# Patient Record
Sex: Female | Born: 1965 | Race: Black or African American | Hispanic: No | Marital: Married | Smoking: Never smoker
Health system: Southern US, Community
[De-identification: ages and names within clinical notes are randomized; demographics above are authoritative.]

## PROBLEM LIST (undated history)

## (undated) DIAGNOSIS — G8929 Other chronic pain: Secondary | ICD-10-CM

## (undated) DIAGNOSIS — M549 Dorsalgia, unspecified: Secondary | ICD-10-CM

## (undated) DIAGNOSIS — T7840XA Allergy, unspecified, initial encounter: Secondary | ICD-10-CM

## (undated) DIAGNOSIS — D649 Anemia, unspecified: Secondary | ICD-10-CM

## (undated) DIAGNOSIS — M4120 Other idiopathic scoliosis, site unspecified: Secondary | ICD-10-CM

## (undated) DIAGNOSIS — D573 Sickle-cell trait: Secondary | ICD-10-CM

## (undated) HISTORY — PX: SPINAL FUSION: SHX223

## (undated) HISTORY — DX: Allergy, unspecified, initial encounter: T78.40XA

## (undated) HISTORY — PX: CHOLECYSTECTOMY: SHX55

## (undated) HISTORY — DX: Anemia, unspecified: D64.9

## (undated) HISTORY — DX: Other idiopathic scoliosis, site unspecified: M41.20

---

## 1993-06-05 HISTORY — PX: SPINAL FUSION: SHX223

## 1998-02-09 ENCOUNTER — Observation Stay (HOSPITAL_COMMUNITY): Admission: EM | Admit: 1998-02-09 | Discharge: 1998-02-10 | Payer: Self-pay | Admitting: Emergency Medicine

## 1998-02-09 ENCOUNTER — Encounter: Payer: Self-pay | Admitting: Emergency Medicine

## 1998-08-17 ENCOUNTER — Inpatient Hospital Stay (HOSPITAL_COMMUNITY): Admission: AD | Admit: 1998-08-17 | Discharge: 1998-08-17 | Payer: Self-pay | Admitting: Obstetrics and Gynecology

## 1998-08-19 ENCOUNTER — Inpatient Hospital Stay (HOSPITAL_COMMUNITY): Admission: AD | Admit: 1998-08-19 | Discharge: 1998-08-19 | Payer: Self-pay | Admitting: Obstetrics and Gynecology

## 1998-08-21 ENCOUNTER — Inpatient Hospital Stay (HOSPITAL_COMMUNITY): Admission: AD | Admit: 1998-08-21 | Discharge: 1998-08-21 | Payer: Self-pay | Admitting: Obstetrics and Gynecology

## 1998-09-08 ENCOUNTER — Inpatient Hospital Stay (HOSPITAL_COMMUNITY): Admission: AD | Admit: 1998-09-08 | Discharge: 1998-09-11 | Payer: Self-pay | Admitting: Obstetrics and Gynecology

## 2001-06-05 HISTORY — PX: WISDOM TOOTH EXTRACTION: SHX21

## 2002-02-25 ENCOUNTER — Other Ambulatory Visit: Admission: RE | Admit: 2002-02-25 | Discharge: 2002-02-25 | Payer: Self-pay | Admitting: Obstetrics and Gynecology

## 2003-02-27 ENCOUNTER — Encounter: Payer: Self-pay | Admitting: Emergency Medicine

## 2003-02-27 ENCOUNTER — Emergency Department (HOSPITAL_COMMUNITY): Admission: EM | Admit: 2003-02-27 | Discharge: 2003-02-27 | Payer: Self-pay | Admitting: Emergency Medicine

## 2003-03-16 ENCOUNTER — Other Ambulatory Visit: Admission: RE | Admit: 2003-03-16 | Discharge: 2003-03-16 | Payer: Self-pay | Admitting: Obstetrics and Gynecology

## 2004-07-20 ENCOUNTER — Other Ambulatory Visit: Admission: RE | Admit: 2004-07-20 | Discharge: 2004-07-20 | Payer: Self-pay | Admitting: Radiology

## 2005-04-21 ENCOUNTER — Encounter: Admission: RE | Admit: 2005-04-21 | Discharge: 2005-04-21 | Payer: Self-pay | Admitting: Internal Medicine

## 2005-05-17 ENCOUNTER — Emergency Department (HOSPITAL_COMMUNITY): Admission: EM | Admit: 2005-05-17 | Discharge: 2005-05-17 | Payer: Self-pay | Admitting: Emergency Medicine

## 2005-05-20 ENCOUNTER — Encounter (INDEPENDENT_AMBULATORY_CARE_PROVIDER_SITE_OTHER): Payer: Self-pay | Admitting: *Deleted

## 2005-05-20 ENCOUNTER — Ambulatory Visit (HOSPITAL_COMMUNITY): Admission: RE | Admit: 2005-05-20 | Discharge: 2005-05-21 | Payer: Self-pay | Admitting: Surgery

## 2005-05-30 ENCOUNTER — Encounter: Admission: RE | Admit: 2005-05-30 | Discharge: 2005-05-30 | Payer: Self-pay | Admitting: Obstetrics and Gynecology

## 2010-04-29 ENCOUNTER — Emergency Department (HOSPITAL_COMMUNITY): Admission: EM | Admit: 2010-04-29 | Discharge: 2010-04-29 | Payer: Self-pay | Admitting: Family Medicine

## 2010-06-25 ENCOUNTER — Encounter: Payer: Self-pay | Admitting: Obstetrics and Gynecology

## 2010-06-26 ENCOUNTER — Encounter: Payer: Self-pay | Admitting: Obstetrics and Gynecology

## 2010-08-16 LAB — POCT URINALYSIS DIPSTICK
Glucose, UA: NEGATIVE mg/dL
Nitrite: NEGATIVE
Protein, ur: NEGATIVE mg/dL
Urobilinogen, UA: 0.2 mg/dL (ref 0.0–1.0)

## 2010-08-16 LAB — URINE CULTURE: Culture: NO GROWTH

## 2010-10-21 NOTE — Op Note (Signed)
Kimberly Berry, Kimberly Berry             ACCOUNT NO.:  [id number]   MEDICAL RECORD NO.:  [id number]          PATIENT TYPE:  OIB   LOCATION:  5707                         FACILITY:  [facility]   PHYSICIAN:  Wilmon Arms. Corliss Skains, M.D. DATE OF BIRTH:  03/19/1966   DATE OF PROCEDURE:  05/23/2005  DATE OF DISCHARGE:  05/21/2005                                 OPERATIVE REPORT   PREOPERATIVE DIAGNOSES:  1.  Chronic calculous cholecystitis.  2.  Umbilical hernia.   POSTOPERATIVE DIAGNOSES:  1.  Chronic calculous cholecystitis.  2.  Umbilical hernia.   PROCEDURES PERFORMED:  1.  Laparoscopic cholecystectomy.  2.  Umbilical hernia repair.   SURGEON:  Wilmon Arms. Corliss Skains, M.D.   ASSISTANT:  Gita Kudo, M.D.   ANESTHESIA:  General endotracheal.   INDICATIONS:  The patient is a 45 year old female who has had documented  cholelithiasis for several years associated with abdominal and back pain on  the right.  This pain has been worsened recently and it tends to radiate  through to her back.  She was seen in consultation in the office.  As we  were performing our examination, we also noted an umbilical hernia.  The  patient presents for cholecystectomy as well as hernia repair.   DESCRIPTION OF PROCEDURE:  The patient was brought to the operating room and  placed in the supine position on the operating room table.  After an  adequate level of general endotracheal anesthesia was obtained, the  patient's abdomen was prepped with Betadine and draped in a sterile fashion.  A curvilinear incision was made just below the umbilicus.  Dissection was  carried down into the subcutaneous tissues with blunt dissection.  The  hernia was easily palpated.  We opened the fascia just below the umbilical  defect.  The peritoneal cavity was entered.  A pursestring suture of 0  Vicryl was placed around the fascial opening.  The Hasson cannula was  inserted and secured with a stay suture.  Pneumoperitoneum was obtained  by  insufflating CO2 and maintaining a maximum pressure of 15 mmHg.  The patient  was rotated to reverse Trendelenburg position slightly to her left.  A 10 mm  port was placed in the subxiphoid position.  Two 5 mm ports were placed in  the right upper quadrant.  The gallbladder was grasped with a clamp and  elevated over the edge of the liver.  The peritoneum at the hilum of the  gallbladder was opened.  The cystic duct was identified, and it was  circumferentially dissected.  The cystic artery was also identified.  A clip  was placed distally on the cystic duct.  A small opening was created on the  cystic duct.  A Cook cholangiogram catheter was brought through a separate  stab incision.  Several attempts were made to pass the catheter.  However,  we met resistance likely due to a spiral valve.  The patient's preoperative  liver function tests had been normal, so the decision was made to abort a  cholangiogram.  The cholangiogram was ligated with clips proximally, then  divided.  The  cystic artery was also ligated with clips and then divided.  Cautery was then used to remove the gallbladder from the liver bed.  The  gallbladder was grasped with a clamp and removed through the umbilical port  site.  All of the liver bed was carefully irrigated and inspected.  No  bleeding was noted.  The irrigant was suctioned out.  Pneumoperitoneum was  then released while removing the ports.  We then turned our attention to the  umbilical hernia.   We extended the transverse incision in both directions.  The patient had a  fairly large hernia sac with thin overlying skin.  The hernia sac was  dissected free from the umbilical skin.  The ends of the fascial opening  were grasped with Kocher clamps.  Prolene #1 sutures were used in figure-of-  eight fashion to close the fascial opening.  A total of four sutures were  used.  Vicryl 3-0 was then used to tack the undersurface of the umbilical  skin down to  the fascia.  Monocryl 4-0 was used to close the skin in  subcuticular fashion.  This was repeated for the other port sites.  All of  these had previously been infiltrated with 0.25% Marcaine.  Steri-Strips and  clean dressings were applied.  The patient was then extubated and brought to  the recovery room in stable condition.  All sponge, instrument and needle  counts were correct.      Wilmon Arms. Tsuei, M.D.  Electronically Signed     MKT/MEDQ  D:  05/20/2005  T:  05/23/2005  Job:  [id number]   cc:   Gaspar Garbe, M.D.  Fax: [phone]

## 2011-04-30 ENCOUNTER — Emergency Department (INDEPENDENT_AMBULATORY_CARE_PROVIDER_SITE_OTHER)
Admission: EM | Admit: 2011-04-30 | Discharge: 2011-04-30 | Disposition: A | Discharge status: Home / Self Care | Payer: Self-pay | Source: Home / Self Care | Attending: Family Medicine | Admitting: Family Medicine

## 2011-04-30 ENCOUNTER — Encounter: Payer: Self-pay | Admitting: *Deleted

## 2011-04-30 ENCOUNTER — Emergency Department (INDEPENDENT_AMBULATORY_CARE_PROVIDER_SITE_OTHER): Payer: Self-pay

## 2011-04-30 DIAGNOSIS — M5030 Other cervical disc degeneration, unspecified cervical region: Secondary | ICD-10-CM

## 2011-04-30 HISTORY — DX: Other chronic pain: G89.29

## 2011-04-30 HISTORY — DX: Dorsalgia, unspecified: M54.9

## 2011-04-30 MED ORDER — IBUPROFEN 800 MG PO TABS: 800.0000 mg | ORAL_TABLET | Freq: Three times a day (TID) | ORAL | Status: AC

## 2011-04-30 MED ORDER — CYCLOBENZAPRINE HCL 5 MG PO TABS: 5.0000 mg | ORAL_TABLET | Freq: Three times a day (TID) | ORAL | Status: AC | PRN

## 2011-04-30 NOTE — ED Provider Notes (Signed)
History     CSN: [id number] Arrival date & time: 04/30/2011  9:57 AM   First MD Initiated Contact with Patient 04/30/11 0930      Chief Complaint  Patient presents with  . Neck Pain  . Jaw Pain  . Shoulder Pain    (Consider location/radiation/quality/duration/timing/severity/associated sxs/prior treatment) Patient is a 45 y.o. female presenting with neck pain and shoulder pain. The history is provided by the patient.  Neck Pain  This is a new problem. The current episode started more than 2 days ago (sitting at table when started, NKI.). The problem occurs constantly. The problem has not changed since onset.The pain is associated with an unknown factor. There has been no fever. The pain is present in the right side. The quality of the pain is described as shooting. The pain radiates to the right scapula, right shoulder and right arm. The pain is mild. The pain is the same all the time. Pertinent negatives include no paresis, no tingling and no weakness. She has tried NSAIDs for the symptoms. The treatment provided no relief.  Shoulder Pain    No past medical history on file.  No past surgical history on file.  No family history on file.  History  Substance Use Topics  . Smoking status: Not on file  . Smokeless tobacco: Not on file  . Alcohol Use: Not on file    OB History    No data available      Review of Systems  Constitutional: Negative.   HENT: Positive for neck pain. Negative for ear pain, sore throat, facial swelling and neck stiffness.   Skin: Negative.   Neurological: Negative.  Negative for tingling and weakness.    Allergies  Review of patient's allergies indicates not on file.  Home Medications  No current outpatient prescriptions on file.  There were no vitals taken for this visit.  Physical Exam  Nursing note and vitals reviewed. Constitutional: She is oriented to person, place, and time. She appears well-developed and well-nourished.  HENT:    Head: Normocephalic.  Right Ear: External ear normal.  Left Ear: External ear normal.  Mouth/Throat: Oropharynx is clear and moist.  Eyes: Pupils are equal, round, and reactive to light.  Neck: Normal range of motion.  Musculoskeletal: She exhibits tenderness.       Right shoulder: She exhibits tenderness. She exhibits normal range of motion, no bony tenderness, no swelling and normal strength.  Lymphadenopathy:    She has no cervical adenopathy.  Neurological: She is alert and oriented to person, place, and time. She has normal reflexes. No cranial nerve deficit.    ED Course  Procedures (including critical care time)  Labs Reviewed - No data to display No results found.   No diagnosis found.    MDM  X-rays reviewed and report per radiologist.         Barkley Bruns, MD 04/30/11 1110

## 2011-04-30 NOTE — ED Notes (Signed)
Started w/ intermittent right jaw, lateral neck, right upper arm, and right posterior shoulder pain 11/22; pain has since become constant and more severe.  Denies parasthesias.  Has been taking Aleve and willow bark with minimal relief.  Right neck is slightly tender to palpation.

## 2011-05-05 ENCOUNTER — Telehealth (HOSPITAL_COMMUNITY): Payer: Self-pay | Admitting: *Deleted

## 2016-01-27 ENCOUNTER — Other Ambulatory Visit: Payer: Self-pay | Admitting: Nurse Practitioner

## 2016-01-27 ENCOUNTER — Ambulatory Visit
Admission: RE | Admit: 2016-01-27 | Discharge: 2016-01-27 | Disposition: A | Discharge status: Home / Self Care | Payer: No Typology Code available for payment source | Source: Ambulatory Visit | Attending: Nurse Practitioner | Admitting: Nurse Practitioner

## 2016-01-27 DIAGNOSIS — M25571 Pain in right ankle and joints of right foot: Secondary | ICD-10-CM

## 2016-10-15 ENCOUNTER — Encounter (HOSPITAL_COMMUNITY): Payer: Self-pay | Admitting: Emergency Medicine

## 2016-10-15 ENCOUNTER — Emergency Department (HOSPITAL_COMMUNITY)
Admission: EM | Admit: 2016-10-15 | Discharge: 2016-10-15 | Disposition: A | Discharge status: Home / Self Care | Payer: Self-pay | Attending: Emergency Medicine | Admitting: Emergency Medicine

## 2016-10-15 ENCOUNTER — Ambulatory Visit (HOSPITAL_COMMUNITY)
Admission: EM | Admit: 2016-10-15 | Discharge: 2016-10-15 | Disposition: A | Discharge status: Nursing Home (Includes ICF) | Payer: Self-pay | Attending: Internal Medicine | Admitting: Internal Medicine

## 2016-10-15 DIAGNOSIS — Z7982 Long term (current) use of aspirin: Secondary | ICD-10-CM | POA: Insufficient documentation

## 2016-10-15 DIAGNOSIS — Z79899 Other long term (current) drug therapy: Secondary | ICD-10-CM | POA: Insufficient documentation

## 2016-10-15 DIAGNOSIS — R1010 Upper abdominal pain, unspecified: Secondary | ICD-10-CM

## 2016-10-15 DIAGNOSIS — K2970 Gastritis, unspecified, without bleeding: Secondary | ICD-10-CM | POA: Insufficient documentation

## 2016-10-15 LAB — COMPREHENSIVE METABOLIC PANEL
ALBUMIN: 4 g/dL (ref 3.5–5.0)
ALT: 17 U/L (ref 14–54)
AST: 22 U/L (ref 15–41)
Alkaline Phosphatase: 73 U/L (ref 38–126)
Anion gap: 11 (ref 5–15)
BUN: 18 mg/dL (ref 6–20)
CHLORIDE: 104 mmol/L (ref 101–111)
CO2: 21 mmol/L — AB (ref 22–32)
CREATININE: 0.69 mg/dL (ref 0.44–1.00)
Calcium: 9.1 mg/dL (ref 8.9–10.3)
GFR calc Af Amer: >60 mL/min (ref >60)
GFR calc non Af Amer: >60 mL/min (ref >60)
GLUCOSE: 82 mg/dL (ref 65–99)
Potassium: 3.7 mmol/L (ref 3.5–5.1)
SODIUM: 136 mmol/L (ref 135–145)
Total Bilirubin: 0.6 mg/dL (ref 0.3–1.2)
Total Protein: 7.9 g/dL (ref 6.5–8.1)

## 2016-10-15 LAB — POCT I-STAT, CHEM 8
BUN: 21 mg/dL — AB (ref 6–20)
CHLORIDE: 107 mmol/L (ref 101–111)
Calcium, Ion: 1.11 mmol/L — ABNORMAL LOW (ref 1.15–1.40)
Creatinine, Ser: 0.7 mg/dL (ref 0.44–1.00)
Glucose, Bld: 79 mg/dL (ref 65–99)
HCT: 37 % (ref 36.0–46.0)
Hemoglobin: 12.6 g/dL (ref 12.0–15.0)
POTASSIUM: 3.8 mmol/L (ref 3.5–5.1)
SODIUM: 140 mmol/L (ref 135–145)
TCO2: 24 mmol/L (ref 0–100)

## 2016-10-15 LAB — CBC
HCT: 37.9 % (ref 36.0–46.0)
Hemoglobin: 12.4 g/dL (ref 12.0–15.0)
MCH: 26.8 pg (ref 26.0–34.0)
MCHC: 32.7 g/dL (ref 30.0–36.0)
MCV: 82 fL (ref 78.0–100.0)
PLATELETS: 246 10*3/uL (ref 150–400)
RBC: 4.62 MIL/uL (ref 3.87–5.11)
RDW: 14.4 % (ref 11.5–15.5)
WBC: 7.9 10*3/uL (ref 4.0–10.5)

## 2016-10-15 LAB — LIPASE, BLOOD: LIPASE: 24 U/L (ref 11–51)

## 2016-10-15 MED ORDER — SUCRALFATE 1 G PO TABS: 1.0000 g | ORAL_TABLET | Freq: Three times a day (TID) | ORAL | 0 refills | Status: DC

## 2016-10-15 MED ORDER — ONDANSETRON HCL 4 MG/2ML IJ SOLN
4.0000 mg | Freq: Once | INTRAMUSCULAR | Status: AC
  Administered 2016-10-15: 4 mg via INTRAVENOUS
  Filled 2016-10-15: qty 2

## 2016-10-15 MED ORDER — GI COCKTAIL ~~LOC~~
30.0000 mL | Freq: Once | ORAL | Status: AC
  Administered 2016-10-15: 30 mL via ORAL
  Filled 2016-10-15: qty 30

## 2016-10-15 MED ORDER — MORPHINE SULFATE (PF) 4 MG/ML IV SOLN
2.0000 mg | Freq: Once | INTRAVENOUS | Status: AC
  Administered 2016-10-15: 2 mg via INTRAVENOUS
  Filled 2016-10-15: qty 1

## 2016-10-15 MED ORDER — GI COCKTAIL ~~LOC~~
ORAL | Status: AC
  Filled 2016-10-15: qty 30

## 2016-10-15 MED ORDER — FENTANYL CITRATE (PF) 100 MCG/2ML IJ SOLN: 50.0000 ug | Freq: Once | INTRAMUSCULAR | Status: DC

## 2016-10-15 MED ORDER — OMEPRAZOLE 20 MG PO CPDR: 20.0000 mg | DELAYED_RELEASE_CAPSULE | Freq: Every day | ORAL | 0 refills | Status: DC

## 2016-10-15 MED ORDER — FENTANYL CITRATE (PF) 100 MCG/2ML IJ SOLN
50.0000 ug | Freq: Once | INTRAMUSCULAR | Status: AC
  Administered 2016-10-15: 50 ug via INTRAMUSCULAR
  Filled 2016-10-15: qty 2

## 2016-10-15 MED ORDER — ONDANSETRON 4 MG PO TBDP: 4.0000 mg | ORAL_TABLET | Freq: Three times a day (TID) | ORAL | 0 refills | Status: DC | PRN

## 2016-10-15 MED ORDER — GI COCKTAIL ~~LOC~~
30.0000 mL | Freq: Once | ORAL | Status: AC
  Administered 2016-10-15: 30 mL via ORAL

## 2016-10-15 NOTE — ED Notes (Signed)
IV team at the bedside. 

## 2016-10-15 NOTE — Discharge Instructions (Signed)
Please read attached information. If you experience any new or worsening signs or symptoms please return to the emergency room for evaluation. Please follow-up with your primary care provider or specialist as discussed. Please use medication prescribed only as directed and discontinue taking if you have any concerning signs or symptoms.   °

## 2016-10-15 NOTE — Discharge Instructions (Signed)
Go directly to ED for higher level of care.

## 2016-10-15 NOTE — ED Notes (Signed)
Notified bill oxford, np of this nurses experience with this patient.

## 2016-10-15 NOTE — ED Triage Notes (Addendum)
Pt sent from urgent care for further eval of  c/o abdominal pain x 2 weeks with Nausea and spitting up onset today. Pt has been talking a lot of tums and ibuprofen. Pt c/o bitter taste in her mouth. Pt given GI cocktail at urgent care but immediately vomited.

## 2016-10-15 NOTE — ED Provider Notes (Signed)
CSN: [id number]     Arrival date & time 10/15/16  1425 History   First MD Initiated Contact with Patient 10/15/16 1525     Chief Complaint  Patient presents with  . Abdominal Pain   (Consider location/radiation/quality/duration/timing/severity/associated sxs/prior Treatment) Patient c/o severe epigastric pain for 2 weeks.  She has been having worsening sx's over last 2 days and she is spitting up and nauseated.  She states she feels weak and tired.  She has difficulty ambulating.  She is accompanied by her husband.   The history is provided by the patient and the spouse.  Abdominal Pain  Pain location:  Epigastric Pain quality: aching   Pain radiates to:  RUQ Pain severity:  Severe Onset quality:  Gradual Duration:  2 weeks Timing:  Constant Progression:  Worsening Chronicity:  New Relieved by:  Vomiting Worsened by:  Nothing Ineffective treatments:  None tried Associated symptoms: fatigue and nausea     Past Medical History:  Diagnosis Date  . Chronic back pain    Past Surgical History:  Procedure Laterality Date  . CHOLECYSTECTOMY    . SPINAL FUSION     T3-L2  . SPINAL FUSION     History reviewed. No pertinent family history. Social History  Substance Use Topics  . Smoking status: Never Smoker  . Smokeless tobacco: Never Used  . Alcohol use Yes     Comment: rare use   OB History    No data available     Review of Systems  Constitutional: Positive for fatigue.  HENT: Negative.   Eyes: Negative.   Respiratory: Negative.   Cardiovascular: Negative.   Gastrointestinal: Positive for abdominal pain and nausea.  Endocrine: Negative.   Genitourinary: Negative.   Musculoskeletal: Negative.   Allergic/Immunologic: Negative.   Neurological: Negative.   Hematological: Negative.   Psychiatric/Behavioral: Negative.     Allergies  Eggs or egg-derived products  Home Medications   Prior to Admission medications   Medication Sig Start Date End Date Taking?  Authorizing Provider  aspirin EC 81 MG tablet Take 81 mg by mouth daily.   Yes [provider]  ibuprofen (ADVIL,MOTRIN) 200 MG tablet Take 200 mg by mouth every 6 (six) hours as needed.   Yes [provider]  Naproxen Sodium (ALEVE PO) Take by mouth.   Yes [provider]   Meds Ordered and Administered this Visit   Medications  gi cocktail (Maalox,Lidocaine,Donnatal) (30 mLs Oral Given 10/15/16 1601)    BP 130/80 (BP Location: Right Arm)   Pulse (!) 102   Temp 98.3 F (36.8 C) (Oral)   Resp 18   SpO2 100%  No data found.   Physical Exam  Constitutional: She is oriented to person, place, and time. She appears well-developed and well-nourished.  HENT:  Head: Normocephalic and atraumatic.  Eyes: Conjunctivae and EOM are normal. Pupils are equal, round, and reactive to light.  Neck: Normal range of motion. Neck supple.  Cardiovascular: Normal rate, regular rhythm and normal heart sounds.   Pulmonary/Chest: Effort normal and breath sounds normal.  Abdominal: Soft. Bowel sounds are normal. There is tenderness.  Tender RUQ and epigastric region.  Musculoskeletal: Normal range of motion.  Neurological: She is alert and oriented to person, place, and time.  Nursing note and vitals reviewed.   Urgent Care Course     Procedures (including critical care time)  Labs Review Labs Reviewed  POCT I-STAT, CHEM 8 - Abnormal; Notable for the following:  Result Value   BUN 21 (*)    Calcium, Ion 1.11 (*)    All other components within normal limits    Imaging Review No results found.   Visual Acuity Review  Right Eye Distance:   Left Eye Distance:   Bilateral Distance:    Right Eye Near:   Left Eye Near:    Bilateral Near:         MDM   1. Pain of upper abdomen    GI cocktail - No results  EKG - Wnl Istat - wnl  Patient is in distress and having difficulty ambulating and her clinical picture is not clear and recommend she be  transferred to ED for higher level of care.    Patient is put in wheel chair and taken to shuttle with husband for ride down to ED.      Lysbeth Penner, Lyman 10/15/16 1626

## 2016-10-15 NOTE — ED Provider Notes (Signed)
[facility] DEPT Provider Note   CSN: [id number] Arrival date & time: 10/15/16  1629   History   Chief Complaint Chief Complaint  Patient presents with  . Abdominal Pain  . Emesis    HPI Kimberly Berry is a 51 y.o. female.  HPI   51 year old female presents today with complaints of abdominal pain.  Patient was seen in urgent care and referred here as they were unable to manage her vomiting.  Patient notes her last 2 weeks she has had epigastric discomfort and a bitter taste in her mouth.  She notes over the last several days this is caused her to spit up and have severe nausea.  She notes the pain is not made worse with food or drink, not worse with palpation.  She denies any lower abdominal pain, denies any fever, diarrhea, constipation.  She notes normal bowel movements, passing gas.  Her past medical history significant for cholecystectomy.  Patient notes she has been under significant stress recently with losing her mother over the last several weeks.  Patient notes she is also using large amounts of ibuprofen, Tylenol, Aleve, and aspirin due to neck pain secondary to cervical fusion.  Patient reports she does not use narcotics at home.   Past Medical History:  Diagnosis Date  . Chronic back pain     There are no active problems to display for this patient.   Past Surgical History:  Procedure Laterality Date  . CHOLECYSTECTOMY    . SPINAL FUSION     T3-L2  . SPINAL FUSION      OB History    No data available       Home Medications    Prior to Admission medications   Medication Sig Start Date End Date Taking? Authorizing Provider  aspirin EC 81 MG tablet Take 81 mg by mouth daily.    [provider]  ibuprofen (ADVIL,MOTRIN) 200 MG tablet Take 200 mg by mouth every 6 (six) hours as needed.    [provider]  Naproxen Sodium (ALEVE PO) Take by mouth.    [provider]  omeprazole (PRILOSEC) 20 MG capsule Take 1 capsule (20 mg  total) by mouth daily. 10/15/16   Aysia Lowder, Dellis Filbert, PA-C  ondansetron (ZOFRAN ODT) 4 MG disintegrating tablet Take 1 tablet (4 mg total) by mouth every 8 (eight) hours as needed for nausea or vomiting. 10/15/16   Davionne Mastrangelo, Dellis Filbert, PA-C  sucralfate (CARAFATE) 1 g tablet Take 1 tablet (1 g total) by mouth 4 (four) times daily -  with meals and at bedtime. 10/15/16   Okey Regal, PA-C    Family History No family history on file.  Social History Social History  Substance Use Topics  . Smoking status: Never Smoker  . Smokeless tobacco: Never Used  . Alcohol use Yes     Comment: rare use     Allergies   Eggs or egg-derived products   Review of Systems Review of Systems  All other systems reviewed and are negative.  Physical Exam Updated Vital Signs BP 113/76   Pulse 76   Temp 98.7 F (37.1 C)   Resp 18   Ht 5\' 4"  (1.626 m)   Wt 102.1 kg   SpO2 96%   BMI 38.62 kg/m   Physical Exam  Constitutional: She is oriented to person, place, and time. She appears well-developed and well-nourished.  HENT:  Head: Normocephalic and atraumatic.  Eyes: Conjunctivae are normal. Pupils are equal, round, and reactive to light. Right  eye exhibits no discharge. Left eye exhibits no discharge. No scleral icterus.  Neck: Normal range of motion. No JVD present. No tracheal deviation present.  Pulmonary/Chest: Effort normal. No stridor.  Abdominal: Soft. Bowel sounds are normal. She exhibits no distension and no mass. There is no tenderness. There is no rebound and no guarding. No hernia.  Neurological: She is alert and oriented to person, place, and time. Coordination normal.  Skin: Skin is warm.  Psychiatric: She has a normal mood and affect. Her behavior is normal. Judgment and thought content normal.  Nursing note and vitals reviewed.   ED Treatments / Results  Labs (all labs ordered are listed, but only abnormal results are displayed) Labs Reviewed  COMPREHENSIVE METABOLIC PANEL -  Abnormal; Notable for the following:       Result Value   CO2 21 (*)    All other components within normal limits  LIPASE, BLOOD  CBC  URINALYSIS, ROUTINE W REFLEX MICROSCOPIC    EKG  EKG Interpretation None      Radiology No results found.  Procedures Procedures (including critical care time)  Medications Ordered in ED Medications  ondansetron (ZOFRAN) injection 4 mg (4 mg Intravenous Given 10/15/16 1910)  fentaNYL (SUBLIMAZE) injection 50 mcg (50 mcg Intramuscular Given 10/15/16 1828)  morphine 4 MG/ML injection 2 mg (2 mg Intravenous Given 10/15/16 1950)  gi cocktail (Maalox,Lidocaine,Donnatal) (30 mLs Oral Given 10/15/16 1950)     Initial Impression / Assessment and Plan / ED Course  I have reviewed the triage vital signs and the nursing notes.  Pertinent labs & imaging results that were available during my care of the patient were reviewed by me and considered in my medical decision making (see chart for details).      Final Clinical Impressions(s) / ED Diagnoses   Final diagnoses:  Gastritis without bleeding, unspecified chronicity, unspecified gastritis type    Labs:  Lipase, CMP, CBC  Therapeutics: Morphine, GI cocktail, fentanyl, Zofran  Discharge Meds: Zofran, Prilosec, Carafate  Assessment/Plan: 48 YOF presents today with epigastric abdominal discomfort.  Patient had an episode of vomiting, with worsening abdominal pain over the last several days.  She is afebrile nontoxic, she has a soft benign abdomen.  Basic labs showed no significant abnormalities.  Patient will be given normal saline, pain medication, antinausea medication with likely follow-up GI cocktail.  Patient has a history of cholecystectomy, low suspicion for any significant intra-abdominal pathology.  Patient has had significant stress recently with the passing of her mother, also using NSAIDs, alcohol use last night.  Patient's pain slightly improved with above medications.  Low suspicion for  significant intra-abdominal pathology.  This is likely gastritis.  Patient will be given a prescription for Zofran, Prilosec, Carafate and encouraged follow-up with her primary care and gastroenterologist for ongoing evaluation and management.  She is given strict return precautions, she verbalized understanding and agreement to today's plan had no further questions or concerns at time of discharge.     New Prescriptions New Prescriptions   OMEPRAZOLE (PRILOSEC) 20 MG CAPSULE    Take 1 capsule (20 mg total) by mouth daily.   ONDANSETRON (ZOFRAN ODT) 4 MG DISINTEGRATING TABLET    Take 1 tablet (4 mg total) by mouth every 8 (eight) hours as needed for nausea or vomiting.   SUCRALFATE (CARAFATE) 1 G TABLET    Take 1 tablet (1 g total) by mouth 4 (four) times daily -  with meals and at bedtime.     Okey Regal,  PA-C 10/15/16 2032    Lacretia Leigh, MD 10/15/16 2100

## 2016-10-15 NOTE — ED Notes (Signed)
Vomiting, bill oxford, np at bedside

## 2016-10-15 NOTE — ED Triage Notes (Signed)
The patient presented to the [facility] with a complaint of epigastric pain x 2 weeks. The patient stated that she has tried Tums with no relief.

## 2016-10-15 NOTE — ED Notes (Signed)
Patient complains of severe center, epigastric pain for 2 weeks.  When she eats or drinks anything, complains of burning in center, epigastric area.  Complains of a bitter taste in mouth and being tired.  Reports pain intensified 2 days ago.   Patient has dull right flank pain.    When ambulating to treatment room, patient;s gait was staggering as she held on to significant others hand.    Patient speaks quietly, eyes are weak, appears very tired.  Significant other has noticed patient being particularly tired about 2 hours ago, phone conversations not lasting as long.  No energy.    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  Patient's mother died this past week,  Patient flew back to [place] yesterday.

## 2017-02-20 LAB — BASIC METABOLIC PANEL
BUN: 12 (ref 4–21)
CREATININE: 0.7 (ref 0.5–1.1)
Glucose: 89
POTASSIUM: 4.5 (ref 3.4–5.3)
Sodium: 141 (ref 137–147)

## 2017-02-20 LAB — LIPID PANEL
Cholesterol: 195 (ref 0–200)
HDL: 50 (ref 35–70)
LDL CALC: 123
LDL/HDL RATIO: 2.5
Triglycerides: 108 (ref 40–160)

## 2017-02-20 LAB — HEPATIC FUNCTION PANEL
ALT: 10 (ref 7–35)
AST: 15 (ref 13–35)
Alkaline Phosphatase: 92 (ref 25–125)

## 2017-02-20 LAB — CBC AND DIFFERENTIAL
HCT: 37 (ref 36–46)
Hemoglobin: 12.6 (ref 12.0–16.0)
WBC: 7.3

## 2017-02-20 LAB — VITAMIN D 25 HYDROXY (VIT D DEFICIENCY, FRACTURES): Vit D, 25-Hydroxy: 64.8

## 2017-02-20 LAB — HEMOGLOBIN A1C: HEMOGLOBIN A1C: 4.9

## 2018-04-02 ENCOUNTER — Encounter: Payer: Self-pay | Admitting: Internal Medicine

## 2018-04-02 DIAGNOSIS — M542 Cervicalgia: Secondary | ICD-10-CM

## 2018-04-03 LAB — HM MAMMOGRAPHY: HM MAMMO: NORMAL (ref 0–4)

## 2018-04-11 ENCOUNTER — Encounter: Payer: Self-pay | Admitting: Internal Medicine

## 2018-04-13 ENCOUNTER — Encounter: Payer: Self-pay | Admitting: Internal Medicine

## 2018-04-23 ENCOUNTER — Ambulatory Visit: Payer: Self-pay | Admitting: Internal Medicine

## 2018-04-23 ENCOUNTER — Encounter: Payer: Self-pay | Admitting: Internal Medicine

## 2018-04-23 VITALS — BP 108/66 | HR 87 | Temp 97.6°F | Ht 63.5 in | Wt 217.6 lb

## 2018-04-23 DIAGNOSIS — D509 Iron deficiency anemia, unspecified: Secondary | ICD-10-CM | POA: Insufficient documentation

## 2018-04-23 DIAGNOSIS — Z23 Encounter for immunization: Secondary | ICD-10-CM

## 2018-04-23 DIAGNOSIS — Z0000 Encounter for general adult medical examination without abnormal findings: Secondary | ICD-10-CM

## 2018-04-23 DIAGNOSIS — E669 Obesity, unspecified: Secondary | ICD-10-CM | POA: Insufficient documentation

## 2018-04-23 DIAGNOSIS — Z7982 Long term (current) use of aspirin: Secondary | ICD-10-CM

## 2018-04-23 DIAGNOSIS — Z1211 Encounter for screening for malignant neoplasm of colon: Secondary | ICD-10-CM

## 2018-04-23 LAB — POCT URINALYSIS DIPSTICK
BILIRUBIN UA: NEGATIVE
Blood, UA: NEGATIVE
Glucose, UA: NEGATIVE
Ketones, UA: NEGATIVE
Leukocytes, UA: NEGATIVE
Nitrite, UA: NEGATIVE
PH UA: 6.5 (ref 5.0–8.0)
Protein, UA: NEGATIVE
Spec Grav, UA: 1.015 (ref 1.010–1.025)
Urobilinogen, UA: 0.2 E.U./dL

## 2018-04-23 MED ORDER — TETANUS-DIPHTH-ACELL PERTUSSIS 5-2.5-18.5 LF-MCG/0.5 IM SUSP
0.5000 mL | Freq: Once | INTRAMUSCULAR | Status: AC
  Administered 2018-04-23: 0.5 mL via INTRAMUSCULAR

## 2018-04-23 NOTE — Progress Notes (Signed)
Subjective:     Patient ID: Kimberly Berry , female    DOB: 1966-05-08 , 52 y.o.   MRN: [id number]   Chief Complaint  Patient presents with  . Annual Exam    HPI  She is here today for a full physical examination. She is followed by GYN for her pelvic exams. She has no specific concerns or complaints at this time.     Past Medical History:  Diagnosis Date  . Chronic back pain   . Idiopathic scoliosis      Family History  Problem Relation Age of Onset  . Dementia Father      No LMP recorded (lmp unknown). (Menstrual status: Irregular Periods)..  Negative for: breast discharge, breast lump(s), breast pain and breast self exam. Associated symptoms include abnormal vaginal bleeding. Pertinent negatives include abnormal bleeding (hematology), anxiety, decreased libido, depression, difficulty falling sleep, dyspareunia, history of infertility, nocturia, sexual dysfunction, sleep disturbances, urinary incontinence, urinary urgency, vaginal discharge and vaginal itching. Diet regular.The patient states her exercise level is  intermittent.   . The patient's tobacco use is:  Social History   Tobacco Use  Smoking Status Never Smoker  Smokeless Tobacco Never Used  . She has been exposed to passive smoke. The patient's alcohol use is:  Social History   Substance and Sexual Activity  Alcohol Use Yes   Comment: rare use    Current Outpatient Medications:  .  Acetaminophen (TYLENOL PO), Take by mouth., Disp: , Rfl:  .  aspirin EC 81 MG tablet, Take 81 mg by mouth daily., Disp: , Rfl:  .  Aspirin-Acetaminophen-Caffeine (EXCEDRIN EXTRA STRENGTH PO), Take by mouth., Disp: , Rfl:  .  Aspirin-Acetaminophen-Caffeine (EXCEDRIN MIGRAINE PO), Take by mouth., Disp: , Rfl:  .  ibuprofen (ADVIL,MOTRIN) 200 MG tablet, Take 200 mg by mouth every 6 (six) hours as needed., Disp: , Rfl:  .  Naproxen Sodium (ALEVE PO), Take by mouth., Disp: , Rfl:  .  omeprazole (PRILOSEC) 20 MG capsule, Take 1  capsule (20 mg total) by mouth daily., Disp: 30 capsule, Rfl: 0  Current Facility-Administered Medications:  .  Tdap (BOOSTRIX) injection 0.5 mL, 0.5 mL, Intramuscular, Once, [place]endale Chard, MD   Allergies  Allergen Reactions  . Eggs Or Egg-Derived Products      Review of Systems  Constitutional: Negative.   HENT: Negative.   Eyes: Negative.   Respiratory: Negative.   Cardiovascular: Negative.   Gastrointestinal: Negative.   Endocrine: Negative.   Genitourinary: Negative.   Musculoskeletal: Negative.   Skin: Negative.   Allergic/Immunologic: Negative.   Neurological: Negative.   Hematological: Negative.   Psychiatric/Behavioral: Negative.      Today's Vitals   04/23/18 1116  BP: 108/66  Pulse: 87  Temp: 97.6 F (36.4 C)  Weight: 217 lb 9.6 oz (98.7 kg)  Height: 5' 3.5" (1.613 m)   Body mass index is 37.94 kg/m.   Objective:  Physical Exam  Constitutional: She is oriented to person, place, and time. She appears well-developed and well-nourished.  HENT:  Head: Normocephalic and atraumatic.  Right Ear: External ear normal.  Left Ear: External ear normal.  Nose: Nose normal.  Mouth/Throat: Oropharynx is clear and moist.  Eyes: Pupils are equal, round, and reactive to light. Conjunctivae and EOM are normal.  Neck: Normal range of motion. Neck supple.  Cardiovascular: Normal rate, regular rhythm, normal heart sounds and intact distal pulses.  Pulmonary/Chest: Effort normal and breath sounds normal. Right breast exhibits no inverted nipple, no mass, no  nipple discharge, no skin change and no tenderness. Left breast exhibits no inverted nipple, no mass, no nipple discharge, no skin change and no tenderness.  Abdominal: Soft. Bowel sounds are normal.  Genitourinary:  Genitourinary Comments: deferred  Musculoskeletal: Normal range of motion.  Neurological: She is alert and oriented to person, place, and time.  Skin: Skin is dry.  Psychiatric: She has a normal mood and  affect.  Nursing note and vitals reviewed.       Assessment And Plan:     1. Routine general medical examination at health care facility  A full exam was performed. Importance of monthly self breast exams was discussed with the patient.  PATIENT HAS BEEN ADVISED TO GET 30-45 MINUTES REGULAR EXERCISE NO LESS THAN FOUR TO FIVE DAYS PER WEEK - BOTH WEIGHTBEARING EXERCISES AND AEROBIC ARE RECOMMENDED.  SHE IS ADVISED TO FOLLOW A HEALTHY DIET WITH AT LEAST SIX FRUITS/VEGGIES PER DAY, DECREASE INTAKE OF RED MEAT, AND TO INCREASE FISH INTAKE TO TWO DAYS PER WEEK.  MEATS/FISH SHOULD NOT BE FRIED, BAKED OR BROILED IS PREFERABLE.  I SUGGEST WEARING SPF 50 SUNSCREEN ON EXPOSED PARTS AND ESPECIALLY WHEN IN THE DIRECT SUNLIGHT FOR AN EXTENDED PERIOD OF TIME.  PLEASE AVOID FAST FOOD RESTAURANTS AND INCREASE YOUR WATER INTAKE.  - Cologuard - CMP14+EGFR - CBC - Lipid panel - Hemoglobin A1c  2. Special screening for malignant neoplasm of colon  She does not wish to move forward with colonoscopy. She does agree to a Cologuard. Pt advised that Lab will contact her to go over pricing.   3. Need for vaccination  - Tdap (BOOSTRIX) injection 0.5 mL        Maximino Greenland, MD

## 2018-04-23 NOTE — Patient Instructions (Signed)
Colorectal Cancer Screening Colorectal cancer screening is a group of tests used to check for colorectal cancer. Colorectal refers to your colon and rectum. Your colon and rectum are located at the end of your large intestine and carry your bowel movements out of your body. Why is colorectal cancer screening done? It is common for abnormal growths (polyps) to form in the lining of your colon, especially as you get older. These polyps can be cancerous or become cancerous. If colorectal cancer is found at an early stage, it is treatable. Who should be screened for colorectal cancer? Screening is recommended for all adults at average risk starting at age 4. Tests may be recommended every 1 to 10 years. Your health care provider may recommend earlier or more frequent screening if you have:  A history of colorectal cancer or polyps.  A family member with a history of colorectal cancer or polyps.  Inflammatory bowel disease, such as ulcerative colitis or Crohn disease.  A type of hereditary colon cancer syndrome.  Colorectal cancer symptoms.  Types of screening tests There are several types of colorectal screening tests. They include:  Guaiac-based fecal occult blood testing.  Fecal immunochemical test (FIT).  Stool DNA test.  Barium enema.  Virtual colonoscopy.  Sigmoidoscopy. During this test, a sigmoidoscope is used to examine your rectum and lower colon. A sigmoidoscope is a flexible tube with a camera that is inserted through your anus into your rectum and lower colon.  Colonoscopy. During this test, a colonoscope is used to examine your entire colon. A colonoscope is a long, thin, flexible tube with a camera. This test examines your entire colon and rectum.  This information is not intended to replace advice given to you by your health care provider. Make sure you discuss any questions you have with your health care provider. Document Released: 11/09/2009 Document Revised:  12/30/2015 Document Reviewed: 08/28/2013 Elsevier Interactive Patient Education  [employer].

## 2018-04-24 LAB — CMP14+EGFR
ALT: 15 IU/L (ref 0–32)
AST: 18 IU/L (ref 0–40)
Albumin/Globulin Ratio: 1.6 (ref 1.2–2.2)
Albumin: 4.5 g/dL (ref 3.5–5.5)
Alkaline Phosphatase: 77 IU/L (ref 39–117)
BILIRUBIN TOTAL: 0.3 mg/dL (ref 0.0–1.2)
BUN/Creatinine Ratio: 17 (ref 9–23)
BUN: 12 mg/dL (ref 6–24)
CALCIUM: 10.1 mg/dL (ref 8.7–10.2)
CHLORIDE: 102 mmol/L (ref 96–106)
CO2: 22 mmol/L (ref 20–29)
Creatinine, Ser: 0.7 mg/dL (ref 0.57–1.00)
GFR, EST AFRICAN AMERICAN: 115 mL/min/{1.73_m2} (ref >59)
GFR, EST NON AFRICAN AMERICAN: 100 mL/min/{1.73_m2} (ref >59)
GLOBULIN, TOTAL: 2.9 g/dL (ref 1.5–4.5)
Glucose: 73 mg/dL (ref 65–99)
Potassium: 4.3 mmol/L (ref 3.5–5.2)
SODIUM: 141 mmol/L (ref 134–144)
TOTAL PROTEIN: 7.4 g/dL (ref 6.0–8.5)

## 2018-04-24 LAB — CBC
HEMOGLOBIN: 11.9 g/dL (ref 11.1–15.9)
Hematocrit: 35.5 % (ref 34.0–46.6)
MCH: 26.7 pg (ref 26.6–33.0)
MCHC: 33.5 g/dL (ref 31.5–35.7)
MCV: 80 fL (ref 79–97)
PLATELETS: 295 10*3/uL (ref 150–450)
RBC: 4.46 x10E6/uL (ref 3.77–5.28)
RDW: 13.6 % (ref 12.3–15.4)
WBC: 7.1 10*3/uL (ref 3.4–10.8)

## 2018-04-24 LAB — LIPID PANEL
Chol/HDL Ratio: 3.4 ratio (ref 0.0–4.4)
Cholesterol, Total: 189 mg/dL (ref 100–199)
HDL: 55 mg/dL (ref >39)
LDL Calculated: 124 mg/dL — ABNORMAL HIGH (ref 0–99)
TRIGLYCERIDES: 51 mg/dL (ref 0–149)
VLDL Cholesterol Cal: 10 mg/dL (ref 5–40)

## 2018-04-24 LAB — HEMOGLOBIN A1C
ESTIMATED AVERAGE GLUCOSE: 94 mg/dL
Hgb A1c MFr Bld: 4.9 % (ref 4.8–5.6)

## 2018-04-26 NOTE — Progress Notes (Signed)
Your labs look great! Bad cholesterol is 124, should be less than 100.  You are on your way!   Keep up the great work!  Sincerely,    Isela Stantz N. [facility], MD

## 2018-10-22 ENCOUNTER — Ambulatory Visit: Payer: Self-pay | Admitting: Internal Medicine

## 2019-04-16 ENCOUNTER — Encounter: Payer: Self-pay | Admitting: Internal Medicine

## 2019-04-16 ENCOUNTER — Ambulatory Visit: Payer: Self-pay | Admitting: Internal Medicine

## 2019-04-16 ENCOUNTER — Other Ambulatory Visit: Payer: Self-pay

## 2019-04-16 VITALS — BP 128/74 | HR 80 | Temp 98.6°F | Ht 64.6 in | Wt 233.8 lb

## 2019-04-16 DIAGNOSIS — E6609 Other obesity due to excess calories: Secondary | ICD-10-CM

## 2019-04-16 DIAGNOSIS — Z0000 Encounter for general adult medical examination without abnormal findings: Secondary | ICD-10-CM

## 2019-04-16 DIAGNOSIS — M542 Cervicalgia: Secondary | ICD-10-CM

## 2019-04-16 DIAGNOSIS — Z6839 Body mass index (BMI) 39.0-39.9, adult: Secondary | ICD-10-CM

## 2019-04-16 LAB — POCT URINALYSIS DIPSTICK
Bilirubin, UA: NEGATIVE
Blood, UA: NEGATIVE
Glucose, UA: NEGATIVE
Ketones, UA: NEGATIVE
Leukocytes, UA: NEGATIVE
Nitrite, UA: NEGATIVE
Protein, UA: NEGATIVE
Spec Grav, UA: 1.02 (ref 1.010–1.025)
Urobilinogen, UA: 0.2 E.U./dL
pH, UA: 6 (ref 5.0–8.0)

## 2019-04-16 NOTE — Progress Notes (Addendum)
Subjective:     Patient ID: Kimberly Berry , female    DOB: June 08, 1965 , 53 y.o.   MRN: [id number]   Chief Complaint  Patient presents with  . Annual Exam    HPI  She is here today for a full physical exam.  She is followed by Dr. Ronita Hipps for her pap smears.  She was last seen in Jan 2020. She reports her pap smear was normal.     Past Medical History:  Diagnosis Date  . Chronic back pain   . Idiopathic scoliosis      Family History  Problem Relation Age of Onset  . Dementia Father      Current Outpatient Medications:  .  Acetaminophen (TYLENOL PO), Take by mouth., Disp: , Rfl:  .  aspirin EC 81 MG tablet, Take 81 mg by mouth daily., Disp: , Rfl:  .  Aspirin-Acetaminophen-Caffeine (EXCEDRIN EXTRA STRENGTH PO), Take by mouth., Disp: , Rfl:  .  b complex vitamins tablet, Take 1 tablet by mouth daily., Disp: , Rfl:  .  cholecalciferol (VITAMIN D3) 25 MCG (1000 UT) tablet, Take 1,000 Units by mouth daily., Disp: , Rfl:  .  ibuprofen (ADVIL,MOTRIN) 200 MG tablet, Take 200 mg by mouth every 6 (six) hours as needed., Disp: , Rfl:  .  MAGNESIUM CHLORIDE-CALCIUM PO, Take by mouth., Disp: , Rfl:  .  Multiple Vitamin (MULTIVITAMIN) capsule, Take 1 capsule by mouth daily., Disp: , Rfl:  .  Naproxen Sodium (ALEVE PO), Take by mouth., Disp: , Rfl:  .  Aspirin-Acetaminophen-Caffeine (EXCEDRIN MIGRAINE PO), Take by mouth., Disp: , Rfl:    Allergies  Allergen Reactions  . Eggs Or Egg-Derived Products   . Shrimp [Shellfish Allergy]      Review of Systems  Constitutional: Negative.   HENT: Negative.   Eyes: Negative.   Respiratory: Negative.   Cardiovascular: Negative.   Endocrine: Negative.   Genitourinary: Negative.   Musculoskeletal: Positive for neck pain.       She c/o intermittent neck pain. Sometimes hears a "cracking" sound when she turns her neck from side to side.   Skin: Negative.   Allergic/Immunologic: Negative.   Neurological: Negative.   Hematological:  Negative.   Psychiatric/Behavioral: Negative.      Today's Vitals   04/16/19 1031  BP: 128/74  Pulse: 80  Temp: 98.6 F (37 C)  TempSrc: Oral  Weight: 233 lb 12.8 oz (106.1 kg)  Height: 5' 4.6" (1.641 m)   Body mass index is 39.39 kg/m.   Objective:  Physical Exam Vitals signs and nursing note reviewed.  Constitutional:      Appearance: Normal appearance. She is obese.  HENT:     Head: Normocephalic and atraumatic.     Right Ear: Tympanic membrane, ear canal and external ear normal.     Left Ear: Tympanic membrane, ear canal and external ear normal.     Nose: Nose normal.     Mouth/Throat:     Mouth: Mucous membranes are moist.     Pharynx: Oropharynx is clear.  Eyes:     Extraocular Movements: Extraocular movements intact.     Conjunctiva/sclera: Conjunctivae normal.     Pupils: Pupils are equal, round, and reactive to light.  Neck:     Musculoskeletal: Normal range of motion and neck supple. Muscular tenderness present.     Vascular: No carotid bruit.  Cardiovascular:     Rate and Rhythm: Normal rate and regular rhythm.     Pulses: Normal pulses.  Heart sounds: Normal heart sounds.  Pulmonary:     Effort: Pulmonary effort is normal.     Breath sounds: Normal breath sounds.  Chest:     Breasts: Tanner Score is 5.        Right: Normal.        Left: Normal.  Abdominal:     General: Bowel sounds are normal.     Palpations: Abdomen is soft.     Comments: Obese.  Genitourinary:    Comments: deferred Musculoskeletal: Normal range of motion.  Lymphadenopathy:     Cervical: No cervical adenopathy.  Skin:    General: Skin is warm and dry.  Neurological:     General: No focal deficit present.     Mental Status: She is alert and oriented to person, place, and time.  Psychiatric:        Mood and Affect: Mood normal.        Behavior: Behavior normal.         Assessment And Plan:     1. Routine general medical examination at health care facility  A full  exam was performed.  Importance of monthly self breast exams was discussed with the patient. PATIENT HAS BEEN ADVISED TO GET 30-45 MINUTES REGULAR EXERCISE NO LESS THAN FOUR TO FIVE DAYS PER WEEK - BOTH WEIGHTBEARING EXERCISES AND AEROBIC ARE RECOMMENDED.  SHE WAS ADVISED TO FOLLOW A HEALTHY DIET WITH AT LEAST SIX FRUITS/VEGGIES PER DAY, DECREASE INTAKE OF RED MEAT, AND TO INCREASE FISH INTAKE TO TWO DAYS PER WEEK.  MEATS/FISH SHOULD NOT BE FRIED, BAKED OR BROILED IS PREFERABLE.  I SUGGEST WEARING SPF 50 SUNSCREEN ON EXPOSED PARTS AND ESPECIALLY WHEN IN THE DIRECT SUNLIGHT FOR AN EXTENDED PERIOD OF TIME.  PLEASE AVOID FAST FOOD RESTAURANTS AND INCREASE YOUR WATER INTAKE.  - CMP14+EGFR - CBC - Lipid panel - TSH  2. 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 I do think her symptoms are exacerbated by muscle spasms. She is advised to apply topical pain cream to neck/shoulders nightly. She is also advised to start magnesium nightly.   3. Class 2 obesity due to excess calories without serious comorbidity with body mass index (BMI) of 39.0 to 39.9 in adult  Importance of achieving optimal weight to decrease risk of cardiovascular disease and cancers was discussed with the patient in full detail. Importance of regular exercise was discussed with the patient in full detail.  She is encouraged to start slowly - start with 10 minutes twice daily at least three to four days per week and to gradually build to 30 minutes five days weekly. She was given tips to incorporate more activity into her daily routine - take stairs when possible, park farther away from her job, grocery stores, etc.     Maximino Greenland, MD    THE PATIENT IS ENCOURAGED TO PRACTICE SOCIAL DISTANCING DUE TO THE COVID-19 PANDEMIC.

## 2019-04-16 NOTE — Patient Instructions (Addendum)
Decrease intake of sugary drinks, white breads, rice and pasta Increase exercise Start magnesium nightly (Calm)- follow directions on packaging Vicks vaporub on shoulders and neck   Health Maintenance, Female Adopting a healthy lifestyle and getting preventive care are important in promoting health and wellness. Ask your health care provider about:  The right schedule for you to have regular tests and exams.  Things you can do on your own to prevent diseases and keep yourself healthy. What should I know about diet, weight, and exercise? Eat a healthy diet   Eat a diet that includes plenty of vegetables, fruits, low-fat dairy products, and lean protein.  Do not eat a lot of foods that are high in solid fats, added sugars, or sodium. Maintain a healthy weight Body mass index (BMI) is used to identify weight problems. It estimates body fat based on height and weight. Your health care provider can help determine your BMI and help you achieve or maintain a healthy weight. Get regular exercise Get regular exercise. This is one of the most important things you can do for your health. Most adults should:  Exercise for at least 150 minutes each week. The exercise should increase your heart rate and make you sweat (moderate-intensity exercise).  Do strengthening exercises at least twice a week. This is in addition to the moderate-intensity exercise.  Spend less time sitting. Even light physical activity can be beneficial. Watch cholesterol and blood lipids Have your blood tested for lipids and cholesterol at 54 years of age, then have this test every 5 years. Have your cholesterol levels checked more often if:  Your lipid or cholesterol levels are high.  You are older than 54 years of age.  You are at high risk for heart disease. What should I know about cancer screening? Depending on your health history and family history, you may need to have cancer screening at various ages. This may  include screening for:  Breast cancer.  Cervical cancer.  Colorectal cancer.  Skin cancer.  Lung cancer. What should I know about heart disease, diabetes, and high blood pressure? Blood pressure and heart disease  High blood pressure causes heart disease and increases the risk of stroke. This is more likely to develop in people who have high blood pressure readings, are of African descent, or are overweight.  Have your blood pressure checked: ? Every 3-5 years if you are 39-12 years of age. ? Every year if you are 35 years old or older. Diabetes Have regular diabetes screenings. This checks your fasting blood sugar level. Have the screening done:  Once every three years after age 72 if you are at a normal weight and have a low risk for diabetes.  More often and at a younger age if you are overweight or have a high risk for diabetes. What should I know about preventing infection? Hepatitis B If you have a higher risk for hepatitis B, you should be screened for this virus. Talk with your health care provider to find out if you are at risk for hepatitis B infection. Hepatitis C Testing is recommended for:  Everyone born from [birth year] through 1966.  Anyone with known risk factors for hepatitis C. Sexually transmitted infections (STIs)  Get screened for STIs, including gonorrhea and chlamydia, if: ? You are sexually active and are younger than 54 years of age. ? You are older than 54 years of age and your health care provider tells you that you are at risk for this type of  infection. ? Your sexual activity has changed since you were last screened, and you are at increased risk for chlamydia or gonorrhea. Ask your health care provider if you are at risk.  Ask your health care provider about whether you are at high risk for HIV. Your health care provider may recommend a prescription medicine to help prevent HIV infection. If you choose to take medicine to prevent HIV, you should first  get tested for HIV. You should then be tested every 3 months for as long as you are taking the medicine. Pregnancy  If you are about to stop having your period (premenopausal) and you may become pregnant, seek counseling before you get pregnant.  Take 400 to 800 micrograms (mcg) of folic acid every day if you become pregnant.  Ask for birth control (contraception) if you want to prevent pregnancy. Osteoporosis and menopause Osteoporosis is a disease in which the bones lose minerals and strength with aging. This can result in bone fractures. If you are 72 years old or older, or if you are at risk for osteoporosis and fractures, ask your health care provider if you should:  Be screened for bone loss.  Take a calcium or vitamin D supplement to lower your risk of fractures.  Be given hormone replacement therapy (HRT) to treat symptoms of menopause. Follow these instructions at home: Lifestyle  Do not use any products that contain nicotine or tobacco, such as cigarettes, e-cigarettes, and chewing tobacco. If you need help quitting, ask your health care provider.  Do not use street drugs.  Do not share needles.  Ask your health care provider for help if you need support or information about quitting drugs. Alcohol use  Do not drink alcohol if: ? Your health care provider tells you not to drink. ? You are pregnant, may be pregnant, or are planning to become pregnant.  If you drink alcohol: ? Limit how much you use to 0-1 drink a day. ? Limit intake if you are breastfeeding.  Be aware of how much alcohol is in your drink. In the [place] one drink equals one 12 oz bottle of beer (355 mL), one 5 oz glass of wine (148 mL), or one 1 oz glass of hard liquor (44 mL). General instructions  Schedule regular health, dental, and eye exams.  Stay current with your vaccines.  Tell your health care provider if: ? You often feel depressed. ? You have ever been abused or do not feel safe at  home. Summary  Adopting a healthy lifestyle and getting preventive care are important in promoting health and wellness.  Follow your health care provider's instructions about healthy diet, exercising, and getting tested or screened for diseases.  Follow your health care provider's instructions on monitoring your cholesterol and blood pressure. This information is not intended to replace advice given to you by your health care provider. Make sure you discuss any questions you have with your health care provider. Document Released: 12/05/2010 Document Revised: 05/15/2018 Document Reviewed: 05/15/2018 Elsevier Patient Education  2020 [employer].

## 2019-04-17 LAB — CMP14+EGFR
ALT: 17 IU/L (ref 0–32)
AST: 21 IU/L (ref 0–40)
Albumin/Globulin Ratio: 1.3 (ref 1.2–2.2)
Albumin: 4.5 g/dL (ref 3.8–4.9)
Alkaline Phosphatase: 103 IU/L (ref 39–117)
BUN/Creatinine Ratio: 19 (ref 9–23)
BUN: 14 mg/dL (ref 6–24)
Bilirubin Total: 0.4 mg/dL (ref 0.0–1.2)
CO2: 23 mmol/L (ref 20–29)
Calcium: 9.9 mg/dL (ref 8.7–10.2)
Chloride: 102 mmol/L (ref 96–106)
Creatinine, Ser: 0.74 mg/dL (ref 0.57–1.00)
GFR calc Af Amer: 107 mL/min/{1.73_m2} (ref >59)
GFR calc non Af Amer: 93 mL/min/{1.73_m2} (ref >59)
Globulin, Total: 3.6 g/dL (ref 1.5–4.5)
Glucose: 84 mg/dL (ref 65–99)
Potassium: 4.4 mmol/L (ref 3.5–5.2)
Sodium: 142 mmol/L (ref 134–144)
Total Protein: 8.1 g/dL (ref 6.0–8.5)

## 2019-04-17 LAB — CBC
Hematocrit: 39.5 % (ref 34.0–46.6)
Hemoglobin: 12.5 g/dL (ref 11.1–15.9)
MCH: 26.8 pg (ref 26.6–33.0)
MCHC: 31.6 g/dL (ref 31.5–35.7)
MCV: 85 fL (ref 79–97)
Platelets: 232 10*3/uL (ref 150–450)
RBC: 4.66 x10E6/uL (ref 3.77–5.28)
RDW: 13.4 % (ref 11.7–15.4)
WBC: 8.4 10*3/uL (ref 3.4–10.8)

## 2019-04-17 LAB — LIPID PANEL
Chol/HDL Ratio: 3.6 ratio (ref 0.0–4.4)
Cholesterol, Total: 200 mg/dL — ABNORMAL HIGH (ref 100–199)
HDL: 55 mg/dL (ref >39)
LDL Chol Calc (NIH): 133 mg/dL — ABNORMAL HIGH (ref 0–99)
Triglycerides: 68 mg/dL (ref 0–149)
VLDL Cholesterol Cal: 12 mg/dL (ref 5–40)

## 2019-04-17 LAB — TSH: TSH: 0.735 u[IU]/mL (ref 0.450–4.500)

## 2019-04-29 ENCOUNTER — Encounter: Payer: Self-pay | Admitting: Internal Medicine

## 2019-05-12 ENCOUNTER — Telehealth: Payer: Self-pay | Admitting: Internal Medicine

## 2019-05-12 NOTE — Telephone Encounter (Signed)
Patient called 05/07/19 regarding a bill she received from our office and [facility] regarding her labs. Patient is a self pay patient told her to disregard her bill from [facility] and I called [facility] on 05/12/19 spoke with Lexine Baton and got the bill added to our account.

## 2019-07-07 DIAGNOSIS — N950 Postmenopausal bleeding: Secondary | ICD-10-CM

## 2019-07-07 HISTORY — DX: Postmenopausal bleeding: N95.0

## 2019-08-28 ENCOUNTER — Ambulatory Visit: Payer: Self-pay | Attending: Internal Medicine

## 2019-08-28 DIAGNOSIS — Z23 Encounter for immunization: Secondary | ICD-10-CM

## 2019-08-28 NOTE — Progress Notes (Signed)
   Covid-19 Vaccination Clinic  Name:  Kimberly Berry    MRN: [id number] DOB: [birth year]  08/28/2019  Kimberly Berry was observed post Covid-19 immunization for 15 minutes without incident. She was provided with Vaccine Information Sheet and instruction to access the V-Safe system.   Kimberly Berry was instructed to call 911 with any severe reactions post vaccine: Marland Kitchen Difficulty breathing  . Swelling of face and throat  . A fast heartbeat  . A bad rash all over body  . Dizziness and weakness   Immunizations Administered    Name Date Dose VIS Date Route   Pfizer COVID-19 Vaccine 08/28/2019  8:59 AM 0.3 mL 05/16/2019 Intramuscular   Manufacturer: [employer]   Lot: [id number]   [place]: [id number]

## 2019-09-22 ENCOUNTER — Ambulatory Visit: Payer: Self-pay | Attending: Internal Medicine

## 2019-09-22 DIAGNOSIS — Z23 Encounter for immunization: Secondary | ICD-10-CM

## 2019-09-22 NOTE — Progress Notes (Signed)
   Covid-19 Vaccination Clinic  Name:  JENNAH HILLIKER    MRN: [id number] DOB: 05-Sep-1965  09/22/2019  Ms. Kottler was observed post Covid-19 immunization for 15 minutes without incident. She was provided with Vaccine Information Sheet and instruction to access the V-Safe system.   Ms. Aslam was instructed to call 911 with any severe reactions post vaccine: Marland Kitchen Difficulty breathing  . Swelling of face and throat  . A fast heartbeat  . A bad rash all over body  . Dizziness and weakness   Immunizations Administered    Name Date Dose VIS Date Route   Pfizer COVID-19 Vaccine 09/22/2019  8:40 AM 0.3 mL 07/30/2018 Intramuscular   Manufacturer: [place]   Lot: [id number]   [place]: [id number]

## 2019-10-14 ENCOUNTER — Ambulatory Visit: Payer: Self-pay | Admitting: Internal Medicine

## 2019-10-14 ENCOUNTER — Other Ambulatory Visit: Payer: Self-pay

## 2019-10-14 ENCOUNTER — Encounter: Payer: Self-pay | Admitting: Internal Medicine

## 2019-10-14 VITALS — BP 114/66 | HR 77 | Temp 98.3°F | Ht 64.6 in | Wt 226.6 lb

## 2019-10-14 DIAGNOSIS — Z1211 Encounter for screening for malignant neoplasm of colon: Secondary | ICD-10-CM

## 2019-10-14 DIAGNOSIS — E7800 Pure hypercholesterolemia, unspecified: Secondary | ICD-10-CM

## 2019-10-14 DIAGNOSIS — N952 Postmenopausal atrophic vaginitis: Secondary | ICD-10-CM

## 2019-10-14 DIAGNOSIS — Z6838 Body mass index (BMI) 38.0-38.9, adult: Secondary | ICD-10-CM

## 2019-10-14 NOTE — Patient Instructions (Signed)
Atrophic Vaginitis Atrophic vaginitis is a condition in which the tissues that line the vagina become dry and thin. This condition occurs in women who have stopped having their period. It is caused by a drop in a female hormone (estrogen). This hormone helps:  To keep the vagina moist.  To make a clear fluid. This clear fluid helps: ? To make the vagina ready for sex. ? To protect the vagina from infection. If the lining of the vagina is dry and thin, it may cause irritation, burning, or itchiness. It may also:  Make sex painful.  Make an exam of your vagina painful.  Cause bleeding.  Make you lose interest in sex.  Cause a burning feeling when you pee (urinate).  Cause a brown or yellow fluid to come from your vagina. Some women do not have symptoms. Follow these instructions at home: Medicines  Take over-the-counter and prescription medicines only as told by your doctor.  Do not use herbs or other medicines unless your doctor says it is okay.  Use medicines for for dryness. These include: ? Oils to make the vagina soft. ? Creams. ? Moisturizers. General instructions  Do not douche.  Do not use products that can make your vagina dry. These include: ? Scented sprays. ? Scented tampons. ? Scented soaps.  Sex can help increase blood flow and soften the tissue in the vagina. If it hurts to have sex: ? Tell your partner. ? Use products to make sex more comfortable. Use these only as told by your doctor. Contact a doctor if you:  Have discharge from the vagina that is different than usual.  Have a bad smell coming from your vagina.  Have new symptoms.  Do not get better.  Get worse. Summary  Atrophic vaginitis is a condition in which the lining of the vagina becomes dry and thin.  This condition affects women who have stopped having their periods.  Treatment may include using products that help make the vagina soft.  Call a doctor if do not get better with  treatment. This information is not intended to replace advice given to you by your health care provider. Make sure you discuss any questions you have with your health care provider. Document Revised: 06/04/2017 Document Reviewed: 06/04/2017 Elsevier Patient Education  2020 [employer]  

## 2019-10-14 NOTE — Progress Notes (Signed)
This visit occurred during the SARS-CoV-2 public health emergency.  Safety protocols were in place, including screening questions prior to the visit, additional usage of staff PPE, and extensive cleaning of exam room while observing appropriate contact time as indicated for disinfecting solutions.  Subjective:     Patient ID: Kimberly Berry , female    DOB: 1965-06-29 , 54 y.o.   MRN: [id number]   Chief Complaint  Patient presents with  . Hyperlipidemia    HPI  She is here today for cholesterol check. States she has changed her diet and started a regular walking regimen.   Hyperlipidemia This is a chronic problem. The current episode started more than 1 year ago. The problem is uncontrolled. Pertinent negatives include no chest pain. Current antihyperlipidemic treatment includes diet change and exercise.     Past Medical History:  Diagnosis Date  . Chronic back pain   . Idiopathic scoliosis      Family History  Problem Relation Age of Onset  . Dementia Father      Current Outpatient Medications:  .  Acetaminophen (TYLENOL PO), Take by mouth., Disp: , Rfl:  .  aspirin EC 81 MG tablet, Take 81 mg by mouth daily., Disp: , Rfl:  .  b complex vitamins tablet, Take 1 tablet by mouth daily., Disp: , Rfl:  .  cholecalciferol (VITAMIN D3) 25 MCG (1000 UT) tablet, Take 1,000 Units by mouth daily., Disp: , Rfl:  .  ibuprofen (ADVIL,MOTRIN) 200 MG tablet, Take 200 mg by mouth every 6 (six) hours as needed., Disp: , Rfl:  .  MAGNESIUM CHLORIDE-CALCIUM PO, Take by mouth., Disp: , Rfl:  .  Multiple Vitamin (MULTIVITAMIN) capsule, Take 1 capsule by mouth daily., Disp: , Rfl:    Allergies  Allergen Reactions  . Eggs Or Egg-Derived Products   . Shrimp [Shellfish Allergy]      Review of Systems  Constitutional: Negative.   Respiratory: Negative.   Cardiovascular: Negative.  Negative for chest pain.  Gastrointestinal: Negative.   Neurological: Negative.   Psychiatric/Behavioral:  Negative.      Today's Vitals   10/14/19 0847  BP: 114/66  Pulse: 77  Temp: 98.3 F (36.8 C)  TempSrc: Oral  Weight: 226 lb 9.6 oz (102.8 kg)  Height: 5' 4.6" (1.641 m)   Body mass index is 38.18 kg/m.   Wt Readings from Last 3 Encounters:  10/14/19 226 lb 9.6 oz (102.8 kg)  04/16/19 233 lb 12.8 oz (106.1 kg)  04/23/18 217 lb 9.6 oz (98.7 kg)     Objective:  Physical Exam Vitals and nursing note reviewed.  Constitutional:      Appearance: Normal appearance. She is obese.  HENT:     Head: Normocephalic and atraumatic.  Cardiovascular:     Rate and Rhythm: Normal rate and regular rhythm.     Heart sounds: Normal heart sounds.  Pulmonary:     Effort: Pulmonary effort is normal.     Breath sounds: Normal breath sounds.  Skin:    General: Skin is warm.  Neurological:     General: No focal deficit present.     Mental Status: She is alert.  Psychiatric:        Mood and Affect: Mood normal.        Behavior: Behavior normal.         Assessment And Plan:     1. Pure hypercholesterolemia  Chronic, I will check fasting lipid panel. She is encouraged to avoid fried foods and to  aim for at least 150 minutes of exercise per week. She was congratulated on her lifestyle changes.   - Lipid panel  2. Atrophic vaginitis  She requests a "natural" product. I will call in vitamin E vaginal suppositories. These will be called into [facility]. She will use nightly prn.   3. Class 2 severe obesity due to excess calories with serious comorbidity and body mass index (BMI) of 38.0 to 38.9 in adult [facility])  She was congratulated on her 7 pound weight loss and encouraged to keep up the great work. She is advised to aim for BMI less than 30 to decrease cardiac risk.   4. Screen for colon cancer  She states she is now ready to schedule colonoscopy. She has been hesitant due to lack of insurance; however, she reports she and her husband are ready to move forward. She is in  agreement with GI referral.   - Ambulatory referral to Gastroenterology     Maximino Greenland, MD    THE PATIENT IS ENCOURAGED TO PRACTICE SOCIAL DISTANCING DUE TO THE COVID-19 PANDEMIC.

## 2019-10-15 ENCOUNTER — Encounter: Payer: Self-pay | Admitting: Gastroenterology

## 2019-10-15 LAB — LIPID PANEL
Chol/HDL Ratio: 3.4 ratio (ref 0.0–4.4)
Cholesterol, Total: 195 mg/dL (ref 100–199)
HDL: 58 mg/dL (ref >39)
LDL Chol Calc (NIH): 121 mg/dL — ABNORMAL HIGH (ref 0–99)
Triglycerides: 90 mg/dL (ref 0–149)
VLDL Cholesterol Cal: 16 mg/dL (ref 5–40)

## 2019-12-05 LAB — HM MAMMOGRAPHY

## 2019-12-09 ENCOUNTER — Encounter: Payer: Self-pay | Admitting: Internal Medicine

## 2019-12-17 ENCOUNTER — Other Ambulatory Visit: Payer: Self-pay

## 2019-12-17 ENCOUNTER — Ambulatory Visit (AMBULATORY_SURGERY_CENTER): Payer: Self-pay

## 2019-12-17 VITALS — Ht 64.6 in | Wt 223.2 lb

## 2019-12-17 DIAGNOSIS — Z1211 Encounter for screening for malignant neoplasm of colon: Secondary | ICD-10-CM

## 2019-12-17 MED ORDER — SUTAB 1479-225-188 MG PO TABS: 1.0000 | ORAL_TABLET | ORAL | 0 refills | Status: DC

## 2019-12-17 NOTE — Progress Notes (Signed)
EGG ALLERGY No soy allergy known to patient  No issues with past sedation with any surgeries or procedures No intubation problems in the past  No diet pills per patient No home 02 use per patient  No blood thinners per patient  Pt denies issues with constipation  No A fib or A flutter  EMMI video to [place] 19 guidelines implemented in PV today   COVID vaccine completed on 09/2019- Sutab sample given to patient at pre visit=[id number]  Exp=01/23 Due to the COVID-19 pandemic we are asking patients to follow these guidelines. Please only bring one care partner. Please be aware that your care partner may wait in the car in the parking lot or if they feel like they will be too hot to wait in the car, they may wait in the lobby on the 4th floor. All care partners are required to wear a mask the entire time (we do not have any that we can provide them), they need to practice social distancing, and we will do a Covid check for all patient's and care partners when you arrive. Also we will check their temperature and your temperature. If the care partner waits in their car they need to stay in the parking lot the entire time and we will call them on their cell phone when the patient is ready for discharge so they can bring the car to the front of the building. Also all patient's will need to wear a mask into building.

## 2019-12-29 ENCOUNTER — Telehealth: Payer: Self-pay | Admitting: Gastroenterology

## 2019-12-29 NOTE — Telephone Encounter (Signed)
Pt is requesting a call back regarding her prep. She has colonoscopy scheduled for Wednesday 7/28

## 2019-12-29 NOTE — Telephone Encounter (Signed)
Pt swallowed a pepper ball with cabbage soup today - she is concerned she needs to cancel her colon wednesday -  Instructed pt she should be okay- try to not swallow or chew any others today, push lots of water and proceed as scheduled-   Pt verbalized understanding

## 2019-12-31 ENCOUNTER — Encounter: Payer: Self-pay | Admitting: Gastroenterology

## 2019-12-31 ENCOUNTER — Ambulatory Visit (AMBULATORY_SURGERY_CENTER): Payer: Self-pay | Admitting: Gastroenterology

## 2019-12-31 ENCOUNTER — Other Ambulatory Visit: Payer: Self-pay

## 2019-12-31 VITALS — BP 127/74 | HR 68 | Temp 97.8°F | Resp 14 | Ht 64.6 in | Wt 223.2 lb

## 2019-12-31 DIAGNOSIS — K635 Polyp of colon: Secondary | ICD-10-CM

## 2019-12-31 DIAGNOSIS — Z800 Family history of malignant neoplasm of digestive organs: Secondary | ICD-10-CM

## 2019-12-31 DIAGNOSIS — D125 Benign neoplasm of sigmoid colon: Secondary | ICD-10-CM

## 2019-12-31 DIAGNOSIS — Z1211 Encounter for screening for malignant neoplasm of colon: Secondary | ICD-10-CM

## 2019-12-31 DIAGNOSIS — D124 Benign neoplasm of descending colon: Secondary | ICD-10-CM

## 2019-12-31 HISTORY — PX: OTHER SURGICAL HISTORY: SHX169

## 2019-12-31 MED ORDER — SODIUM CHLORIDE 0.9 % IV SOLN: 500.0000 mL | Freq: Once | INTRAVENOUS | Status: DC

## 2019-12-31 NOTE — Progress Notes (Signed)
Report to PACU, RN, vss, BBS= Clear.  

## 2019-12-31 NOTE — Progress Notes (Signed)
Called to room to assist during endoscopic procedure.  Patient ID and intended procedure confirmed with present staff. Received instructions for my participation in the procedure from the performing physician.  

## 2019-12-31 NOTE — Progress Notes (Signed)
Patient had pre visit states no change

## 2019-12-31 NOTE — Op Note (Signed)
[facility] Patient Name: Kimberly Berry Procedure Date: 12/31/2019 9:16 AM MRN: [id number] Endoscopist: Thornton Park MD, MD Age: 54 Referring MD:  Date of Birth: [birth year] Gender: Female Account #: [id number] Procedure:                Colonoscopy Indications:              Screening for colorectal malignant neoplasm, This                            is the patient's first colonoscopy                           No known family history of colon cancer or polyps Medicines:                Monitored Anesthesia Care Procedure:                Pre-Anesthesia Assessment:                           - Prior to the procedure, a History and Physical                            was performed, and patient medications and                            allergies were reviewed. The patient's tolerance of                            previous anesthesia was also reviewed. The risks                            and benefits of the procedure and the sedation                            options and risks were discussed with the patient.                            All questions were answered, and informed consent                            was obtained. Prior Anticoagulants: The patient has                            taken no previous anticoagulant or antiplatelet                            agents. ASA Grade Assessment: II - A patient with                            mild systemic disease. After reviewing the risks                            and benefits, the patient was deemed in  satisfactory condition to undergo the procedure.                           After obtaining informed consent, the colonoscope                            was passed under direct vision. Throughout the                            procedure, the patient's blood pressure, pulse, and                            oxygen saturations were monitored continuously. The                            Colonoscope was  introduced through the anus and                            advanced to the 3 cm into the ileum. A second                            forward view of the right colon was performed. The                            colonoscopy was performed without difficulty. The                            patient tolerated the procedure well. The quality                            of the bowel preparation was good. The terminal                            ileum, ileocecal valve, appendiceal orifice, and                            rectum were photographed. Scope In: 9:25:33 AM Scope Out: 9:38:34 AM Scope Withdrawal Time: 0 hours 8 minutes 31 seconds  Total Procedure Duration: 0 hours 13 minutes 1 second  Findings:                 The perianal and digital rectal examinations were                            normal.                           Non-bleeding internal hemorrhoids were found.                           Multiple small and large-mouthed diverticula were                            found in the sigmoid colon and distal descending  colon.                           Two sessile polyps were found in the sigmoid colon                            and descending colon. The polyps were 1 to 2 mm in                            size. These polyps were removed with a cold snare.                            Resection and retrieval were complete. Estimated                            blood loss was minimal.                           The exam was otherwise without abnormality on                            direct and retroflexion views. Complications:            No immediate complications. Estimated blood loss:                            Minimal. Estimated Blood Loss:     Estimated blood loss was minimal. Impression:               - Non-bleeding internal hemorrhoids.                           - Diverticulosis in the sigmoid colon and in the                            distal descending colon.                            - Two 1 to 2 mm polyps in the sigmoid colon and in                            the descending colon, removed with a cold snare.                            Resected and retrieved.                           - The examination was otherwise normal on direct                            and retroflexion views. Recommendation:           - Patient has a contact number available for                            emergencies. The signs and symptoms  of potential                            delayed complications were discussed with the                            patient. Return to normal activities tomorrow.                            Written discharge instructions were provided to the                            patient.                           - Follow a high fiber diet. Drink at least 64                            ounces of water daily. Add a daily stool bulking                            agent such as psyllium (an exampled would be                            Metamucil).                           - Continue present medications.                           - Await pathology results.                           - Repeat colonoscopy date to be determined after                            pending pathology results are reviewed for                            surveillance.                           - Emerging evidence supports eating a diet of                            fruits, vegetables, grains, calcium, and yogurt                            while reducing red meat and alcohol may reduce the                            risk of colon cancer.                           - Thank you for allowing me to be involved in your  colon cancer prevention. Thornton Park MD, MD 12/31/2019 9:44:22 AM This report has been signed electronically.

## 2019-12-31 NOTE — Patient Instructions (Signed)
Handouts on diverticulosis and polyps given to you today  Await pathology results from Dr. Tarri Glenn     YOU HAD AN ENDOSCOPIC PROCEDURE TODAY AT THE [facility]:   Refer to the procedure report that was given to you for any specific questions about what was found during the examination.  If the procedure report does not answer your questions, please call your gastroenterologist to clarify.  If you requested that your care partner not be given the details of your procedure findings, then the procedure report has been included in a sealed envelope for you to review at your convenience later.  YOU SHOULD EXPECT: Some feelings of bloating in the abdomen. Passage of more gas than usual.  Walking can help get rid of the air that was put into your GI tract during the procedure and reduce the bloating. If you had a lower endoscopy (such as a colonoscopy or flexible sigmoidoscopy) you may notice spotting of blood in your stool or on the toilet paper. If you underwent a bowel prep for your procedure, you may not have a normal bowel movement for a few days.  Please Note:  You might notice some irritation and congestion in your nose or some drainage.  This is from the oxygen used during your procedure.  There is no need for concern and it should clear up in a day or so.  SYMPTOMS TO REPORT IMMEDIATELY:   Following lower endoscopy (colonoscopy or flexible sigmoidoscopy):  Excessive amounts of blood in the stool  Significant tenderness or worsening of abdominal pains  Swelling of the abdomen that is new, acute  Fever of 100F or higher  For urgent or emergent issues, a gastroenterologist can be reached at any hour by calling [phone]. Do not use MyChart messaging for urgent concerns.    DIET:  We do recommend a small meal at first, but then you may proceed to your regular diet.  Drink plenty of fluids but you should avoid alcoholic beverages for 24 hours.  ACTIVITY:  You should plan to  take it easy for the rest of today and you should NOT DRIVE or use heavy machinery until tomorrow (because of the sedation medicines used during the test).    FOLLOW UP: Our staff will call the number listed on your records 48-72 hours following your procedure to check on you and address any questions or concerns that you may have regarding the information given to you following your procedure. If we do not reach you, we will leave a message.  We will attempt to reach you two times.  During this call, we will ask if you have developed any symptoms of COVID 19. If you develop any symptoms (ie: fever, flu-like symptoms, shortness of breath, cough etc.) before then, please call [phone].  If you test positive for Covid 19 in the 2 weeks post procedure, please call and report this information to [place].    If any biopsies were taken you will be contacted by phone or by letter within the next 1-3 weeks.  Please call us at [phone] if you have not heard about the biopsies in 3 weeks.    SIGNATURES/CONFIDENTIALITY: You and/or your care partner have signed paperwork which will be entered into your electronic medical record.  These signatures attest to the fact that that the information above on your After Visit Summary has been reviewed and is understood.  Full responsibility of the confidentiality of this discharge information lies with you and/or your  care-partner.

## 2020-01-02 ENCOUNTER — Telehealth: Payer: Self-pay

## 2020-01-02 ENCOUNTER — Telehealth: Payer: Self-pay | Admitting: *Deleted

## 2020-01-02 NOTE — Telephone Encounter (Signed)
  Follow up Call-  Call back number 12/31/2019  Post procedure Call Back phone  # [phone]  Permission to leave phone message Yes  Some recent data might be hidden     Patient questions:  Do you have a fever, pain , or abdominal swelling? No. Pain Score  0 *  Have you tolerated food without any problems? Yes.    Have you been able to return to your normal activities? Yes.    Do you have any questions about your discharge instructions: Diet   No. Medications  No. Follow up visit  No.  Do you have questions or concerns about your Care? No.  Actions: * If pain score is 4 or above: No action needed, pain <4.  1. Have you developed a fever since your procedure? no  2.   Have you had an respiratory symptoms (SOB or cough) since your procedure? no  3.   Have you tested positive for COVID 19 since your procedure no  4.   Have you had any family members/close contacts diagnosed with the COVID 19 since your procedure?  no   If yes to any of these questions please route to Joylene John, RN and Erenest Rasher, RN

## 2020-01-02 NOTE — Telephone Encounter (Signed)
LVM

## 2020-01-05 ENCOUNTER — Encounter: Payer: Self-pay | Admitting: Gastroenterology

## 2020-02-02 ENCOUNTER — Other Ambulatory Visit: Payer: Self-pay | Admitting: Obstetrics and Gynecology

## 2020-02-12 ENCOUNTER — Other Ambulatory Visit: Payer: Self-pay

## 2020-02-12 ENCOUNTER — Encounter (HOSPITAL_BASED_OUTPATIENT_CLINIC_OR_DEPARTMENT_OTHER): Payer: Self-pay | Admitting: Obstetrics and Gynecology

## 2020-02-12 NOTE — Progress Notes (Signed)
Spoke w/ via phone for pre-op interview---pt Lab needs dos---cbc             Lab results------none COVID test ------02-14-2020 1220 pm Arrive at -------1240 pm 1240 pm 02-18-2020 NPO after MN NO Solid Food.  Clear liquids from MN until---1140 am then npo Medications to take morning of surgery -----none Diabetic medication -----n/a Patient Special Instructions -----none Pre-Op special Istructions -----none Patient verbalized understanding of instructions that were given at this phone interview. Patient denies shortness of breath, chest pain, fever, cough at this phone interview.

## 2020-02-14 ENCOUNTER — Other Ambulatory Visit (HOSPITAL_COMMUNITY)
Admission: RE | Admit: 2020-02-14 | Discharge: 2020-02-14 | Disposition: A | Discharge status: Home / Self Care | Payer: HRSA Program | Source: Ambulatory Visit | Attending: Obstetrics and Gynecology | Admitting: Obstetrics and Gynecology

## 2020-02-14 DIAGNOSIS — Z20822 Contact with and (suspected) exposure to covid-19: Secondary | ICD-10-CM | POA: Insufficient documentation

## 2020-02-14 DIAGNOSIS — Z01812 Encounter for preprocedural laboratory examination: Secondary | ICD-10-CM | POA: Insufficient documentation

## 2020-02-14 LAB — SARS CORONAVIRUS 2 (TAT 6-24 HRS): SARS Coronavirus 2: NEGATIVE

## 2020-02-17 NOTE — H&P (Signed)
Kimberly Berry is an 54 y.o. female. PMB for diag hs.  Pertinent Gynecological History: Menses: post-menopausal Bleeding: post menopausal bleeding Contraception: none DES exposure: denies Blood transfusions: none Sexually transmitted diseases: no past history Previous GYN Procedures: DNC  Last mammogram: normal Date: 2020 Last pap: normal Date: 2021 OB History: G1, P1   Menstrual History: Menarche age: 66 Patient's last menstrual period was 06/05/2017.    Past Medical History:  Diagnosis Date  . Anemia yrs ago   hx IDA  . Chronic back pain   . Idiopathic scoliosis   . PMB (postmenopausal bleeding) 07/2019   x 1  . Sickle cell trait [facility])     Past Surgical History:  Procedure Laterality Date  . CESAREAN SECTION  2000  . CHOLECYSTECTOMY     2006  . colonscopy  12/31/2019    2 polyps removed  recheck  in 10 yrs  . SPINAL FUSION  1995   T3-L2. IDIOPATHIC SCOLIOSIS  . WISDOM TOOTH EXTRACTION  2003    Family History  Problem Relation Age of Onset  . Dementia Father   . Colon polyps Neg Hx   . Colon cancer Neg Hx   . Esophageal cancer Neg Hx   . Stomach cancer Neg Hx   . Rectal cancer Neg Hx     Social History:  reports that she has never smoked. She has never used smokeless tobacco. She reports previous alcohol use. She reports that she does not use drugs.  Allergies:  Allergies  Allergen Reactions  . Eggs Or Egg-Derived Products     Vomiting, cramps, red hives  . Shrimp [Shellfish Allergy]     Difficulty breathing    No medications prior to admission.    Review of Systems  Constitutional: Negative.   All other systems reviewed and are negative.   Height 5' 4.25" (1.632 m), weight 99.8 kg, last menstrual period 06/05/2017. Physical Exam Constitutional:      Appearance: Normal appearance.  HENT:     Head: Normocephalic and atraumatic.  Cardiovascular:     Rate and Rhythm: Normal rate and regular rhythm.     Pulses: Normal pulses.     Heart  sounds: Normal heart sounds.  Pulmonary:     Effort: Pulmonary effort is normal.     Breath sounds: Normal breath sounds.  Abdominal:     General: Abdomen is flat.     Palpations: Abdomen is soft.  Genitourinary:    General: Normal vulva.  Musculoskeletal:        General: Normal range of motion.     Cervical back: Normal range of motion and neck supple.  Skin:    General: Skin is warm and dry.  Neurological:     General: No focal deficit present.     Mental Status: She is alert and oriented to person, place, and time.  Psychiatric:        Mood and Affect: Mood normal.        Behavior: Behavior normal.     No results found for this or any previous visit (from the past 24 hour(s)).  No results found.  Assessment/Plan: PMB Diag HS, D&C,myosure Surgical risks discussed. Consent done.  Rosalynn Sergent J 02/17/2020, 9:24 PM

## 2020-02-18 ENCOUNTER — Ambulatory Visit (HOSPITAL_BASED_OUTPATIENT_CLINIC_OR_DEPARTMENT_OTHER): Payer: Self-pay | Admitting: Anesthesiology

## 2020-02-18 ENCOUNTER — Encounter (HOSPITAL_BASED_OUTPATIENT_CLINIC_OR_DEPARTMENT_OTHER): Payer: Self-pay | Admitting: Obstetrics and Gynecology

## 2020-02-18 ENCOUNTER — Ambulatory Visit (HOSPITAL_BASED_OUTPATIENT_CLINIC_OR_DEPARTMENT_OTHER)
Admission: RE | Admit: 2020-02-18 | Discharge: 2020-02-18 | Disposition: A | Discharge status: Home / Self Care | Payer: Self-pay | Attending: Obstetrics and Gynecology | Admitting: Obstetrics and Gynecology

## 2020-02-18 ENCOUNTER — Encounter (HOSPITAL_BASED_OUTPATIENT_CLINIC_OR_DEPARTMENT_OTHER)
Admission: RE | Disposition: A | Discharge status: Home / Self Care | Payer: Self-pay | Source: Home / Self Care | Attending: Obstetrics and Gynecology

## 2020-02-18 DIAGNOSIS — Z820 Family history of epilepsy and other diseases of the nervous system: Secondary | ICD-10-CM | POA: Insufficient documentation

## 2020-02-18 DIAGNOSIS — M549 Dorsalgia, unspecified: Secondary | ICD-10-CM | POA: Insufficient documentation

## 2020-02-18 DIAGNOSIS — M419 Scoliosis, unspecified: Secondary | ICD-10-CM | POA: Insufficient documentation

## 2020-02-18 DIAGNOSIS — Z9049 Acquired absence of other specified parts of digestive tract: Secondary | ICD-10-CM | POA: Insufficient documentation

## 2020-02-18 DIAGNOSIS — Z91012 Allergy to eggs: Secondary | ICD-10-CM | POA: Insufficient documentation

## 2020-02-18 DIAGNOSIS — N950 Postmenopausal bleeding: Secondary | ICD-10-CM | POA: Insufficient documentation

## 2020-02-18 DIAGNOSIS — Z6838 Body mass index (BMI) 38.0-38.9, adult: Secondary | ICD-10-CM | POA: Insufficient documentation

## 2020-02-18 DIAGNOSIS — E669 Obesity, unspecified: Secondary | ICD-10-CM | POA: Insufficient documentation

## 2020-02-18 DIAGNOSIS — Z981 Arthrodesis status: Secondary | ICD-10-CM | POA: Insufficient documentation

## 2020-02-18 DIAGNOSIS — D573 Sickle-cell trait: Secondary | ICD-10-CM | POA: Insufficient documentation

## 2020-02-18 DIAGNOSIS — Z91013 Allergy to seafood: Secondary | ICD-10-CM | POA: Insufficient documentation

## 2020-02-18 DIAGNOSIS — N840 Polyp of corpus uteri: Secondary | ICD-10-CM | POA: Insufficient documentation

## 2020-02-18 DIAGNOSIS — G8929 Other chronic pain: Secondary | ICD-10-CM | POA: Insufficient documentation

## 2020-02-18 HISTORY — PX: DILATATION & CURETTAGE/HYSTEROSCOPY WITH MYOSURE: SHX6511

## 2020-02-18 HISTORY — DX: Sickle-cell trait: D57.3

## 2020-02-18 LAB — CBC
HCT: 38.4 % (ref 36.0–46.0)
Hemoglobin: 12.8 g/dL (ref 12.0–15.0)
MCH: 28.2 pg (ref 26.0–34.0)
MCHC: 33.3 g/dL (ref 30.0–36.0)
MCV: 84.6 fL (ref 80.0–100.0)
Platelets: 314 10*3/uL (ref 150–400)
RBC: 4.54 MIL/uL (ref 3.87–5.11)
RDW: 13.2 % (ref 11.5–15.5)
WBC: 8 10*3/uL (ref 4.0–10.5)
nRBC: 0 % (ref 0.0–0.2)

## 2020-02-18 SURGERY — DILATATION & CURETTAGE/HYSTEROSCOPY WITH MYOSURE
Anesthesia: Monitor Anesthesia Care | Site: Vagina

## 2020-02-18 MED ORDER — DEXAMETHASONE SODIUM PHOSPHATE 10 MG/ML IJ SOLN
INTRAMUSCULAR | Status: DC | PRN
  Administered 2020-02-18: 10 mg via INTRAVENOUS

## 2020-02-18 MED ORDER — DIPHENHYDRAMINE HCL 50 MG/ML IJ SOLN
INTRAMUSCULAR | Status: AC
  Filled 2020-02-18: qty 1

## 2020-02-18 MED ORDER — POVIDONE-IODINE 10 % EX SWAB: 2.0000 "application " | Freq: Once | CUTANEOUS | Status: DC

## 2020-02-18 MED ORDER — PROPOFOL 10 MG/ML IV BOLUS
INTRAVENOUS | Status: DC | PRN
  Administered 2020-02-18: 150 mg via INTRAVENOUS
  Administered 2020-02-18: 50 mg via INTRAVENOUS

## 2020-02-18 MED ORDER — LIDOCAINE 2% (20 MG/ML) 5 ML SYRINGE
INTRAMUSCULAR | Status: AC
  Filled 2020-02-18: qty 5

## 2020-02-18 MED ORDER — LACTATED RINGERS IV SOLN: INTRAVENOUS | Status: DC

## 2020-02-18 MED ORDER — OXYCODONE HCL 5 MG PO TABS: 5.0000 mg | ORAL_TABLET | Freq: Once | ORAL | Status: DC | PRN

## 2020-02-18 MED ORDER — FENTANYL CITRATE (PF) 100 MCG/2ML IJ SOLN: 25.0000 ug | INTRAMUSCULAR | Status: DC | PRN

## 2020-02-18 MED ORDER — DIPHENHYDRAMINE HCL 50 MG/ML IJ SOLN
INTRAMUSCULAR | Status: DC | PRN
  Administered 2020-02-18: 25 mg via INTRAVENOUS

## 2020-02-18 MED ORDER — CEFAZOLIN SODIUM-DEXTROSE 2-4 GM/100ML-% IV SOLN
INTRAVENOUS | Status: AC
  Filled 2020-02-18: qty 100

## 2020-02-18 MED ORDER — TRAMADOL HCL 50 MG PO TABS
ORAL_TABLET | ORAL | Status: AC
  Filled 2020-02-18: qty 1

## 2020-02-18 MED ORDER — TRAMADOL HCL 50 MG PO TABS
50.0000 mg | ORAL_TABLET | Freq: Four times a day (QID) | ORAL | Status: DC | PRN
  Administered 2020-02-18: 50 mg via ORAL

## 2020-02-18 MED ORDER — SUCCINYLCHOLINE CHLORIDE 20 MG/ML IJ SOLN
INTRAMUSCULAR | Status: DC | PRN
  Administered 2020-02-18: 160 mg via INTRAVENOUS

## 2020-02-18 MED ORDER — PROMETHAZINE HCL 25 MG/ML IJ SOLN: 6.2500 mg | INTRAMUSCULAR | Status: DC | PRN

## 2020-02-18 MED ORDER — SUCCINYLCHOLINE CHLORIDE 200 MG/10ML IV SOSY
PREFILLED_SYRINGE | INTRAVENOUS | Status: AC
  Filled 2020-02-18: qty 10

## 2020-02-18 MED ORDER — DEXAMETHASONE SODIUM PHOSPHATE 10 MG/ML IJ SOLN
INTRAMUSCULAR | Status: AC
  Filled 2020-02-18: qty 1

## 2020-02-18 MED ORDER — VASOPRESSIN 20 UNIT/ML IV SOLN
INTRAVENOUS | Status: DC | PRN
  Administered 2020-02-18: 20 mL via INTRAMUSCULAR

## 2020-02-18 MED ORDER — OXYCODONE HCL 5 MG/5ML PO SOLN: 5.0000 mg | Freq: Once | ORAL | Status: DC | PRN

## 2020-02-18 MED ORDER — PROPOFOL 10 MG/ML IV BOLUS
INTRAVENOUS | Status: AC
  Filled 2020-02-18: qty 40

## 2020-02-18 MED ORDER — KETOROLAC TROMETHAMINE 30 MG/ML IJ SOLN
INTRAMUSCULAR | Status: AC
  Filled 2020-02-18: qty 1

## 2020-02-18 MED ORDER — ONDANSETRON HCL 4 MG/2ML IJ SOLN
INTRAMUSCULAR | Status: DC | PRN
  Administered 2020-02-18: 4 mg via INTRAVENOUS

## 2020-02-18 MED ORDER — WHITE PETROLATUM EX OINT
TOPICAL_OINTMENT | CUTANEOUS | Status: AC
  Filled 2020-02-18: qty 5

## 2020-02-18 MED ORDER — CEFAZOLIN SODIUM-DEXTROSE 2-4 GM/100ML-% IV SOLN
2.0000 g | INTRAVENOUS | Status: AC
  Administered 2020-02-18: 2 g via INTRAVENOUS

## 2020-02-18 MED ORDER — TRAMADOL HCL 50 MG PO TABS: 50.0000 mg | ORAL_TABLET | Freq: Four times a day (QID) | ORAL | 0 refills | Status: DC | PRN

## 2020-02-18 MED ORDER — MIDAZOLAM HCL 5 MG/5ML IJ SOLN
INTRAMUSCULAR | Status: DC | PRN
  Administered 2020-02-18: 2 mg via INTRAVENOUS

## 2020-02-18 MED ORDER — KETOROLAC TROMETHAMINE 30 MG/ML IJ SOLN
INTRAMUSCULAR | Status: DC | PRN
  Administered 2020-02-18: 30 mg via INTRAVENOUS

## 2020-02-18 MED ORDER — BUPIVACAINE HCL (PF) 0.25 % IJ SOLN
INTRAMUSCULAR | Status: DC | PRN
  Administered 2020-02-18: 20 mL

## 2020-02-18 MED ORDER — MIDAZOLAM HCL 2 MG/2ML IJ SOLN
INTRAMUSCULAR | Status: AC
  Filled 2020-02-18: qty 2

## 2020-02-18 MED ORDER — SODIUM CHLORIDE 0.9 % IR SOLN
Status: DC | PRN
  Administered 2020-02-18: 3000 mL

## 2020-02-18 MED ORDER — ONDANSETRON HCL 4 MG/2ML IJ SOLN
INTRAMUSCULAR | Status: AC
  Filled 2020-02-18: qty 2

## 2020-02-18 MED ORDER — FENTANYL CITRATE (PF) 100 MCG/2ML IJ SOLN
INTRAMUSCULAR | Status: AC
  Filled 2020-02-18: qty 2

## 2020-02-18 SURGICAL SUPPLY — 21 items
BAG DECANTER FOR FLEXI CONT (MISCELLANEOUS) ×2 IMPLANT
CATH ROBINSON RED A/P 16FR (CATHETERS) ×3 IMPLANT
DECANTER SPIKE VIAL GLASS SM (MISCELLANEOUS) ×4 IMPLANT
DEVICE MYOSURE REACH (MISCELLANEOUS) ×2 IMPLANT
DILATOR CANAL MILEX (MISCELLANEOUS) ×2 IMPLANT
GLOVE BIO SURGEON STRL SZ7 (GLOVE) ×2 IMPLANT
GLOVE BIO SURGEON STRL SZ7.5 (GLOVE) ×3 IMPLANT
GLOVE BIOGEL PI IND STRL 7.0 (GLOVE) ×1 IMPLANT
GLOVE BIOGEL PI INDICATOR 7.0 (GLOVE) ×4
GOWN STRL REUS W/TWL LRG LVL3 (GOWN DISPOSABLE) ×6 IMPLANT
IV NS IRRIG 3000ML ARTHROMATIC (IV SOLUTION) ×3 IMPLANT
KIT PROCEDURE FLUENT (KITS) ×3 IMPLANT
NDL SAFETY ECLIPSE 18X1.5 (NEEDLE) IMPLANT
NDL SPNL 22GX3.5 QUINCKE BK (NEEDLE) ×1 IMPLANT
NEEDLE HYPO 18GX1.5 SHARP (NEEDLE) ×3
NEEDLE SPNL 22GX3.5 QUINCKE BK (NEEDLE) ×3 IMPLANT
PACK VAGINAL MINOR WOMEN LF (CUSTOM PROCEDURE TRAY) ×3 IMPLANT
PAD OB MATERNITY 4.3X12.25 (PERSONAL CARE ITEMS) ×3 IMPLANT
SEAL ROD LENS SCOPE MYOSURE (ABLATOR) ×3 IMPLANT
SYR 5ML LL (SYRINGE) ×2 IMPLANT
TOWEL OR 17X26 10 PK STRL BLUE (TOWEL DISPOSABLE) ×2 IMPLANT

## 2020-02-18 NOTE — Discharge Instructions (Signed)
  Post Anesthesia Home Care Instructions  Activity: Get plenty of rest for the remainder of the day. A responsible adult should stay with you for 24 hours following the procedure.  For the next 24 hours, DO NOT: -Drive a car -Operate machinery -Drink alcoholic beverages -Take any medication unless instructed by your physician -Make any legal decisions or sign important papers.  Meals: Start with liquid foods such as gelatin or soup. Progress to regular foods as tolerated. Avoid greasy, spicy, heavy foods. If nausea and/or vomiting occur, drink only clear liquids until the nausea and/or vomiting subsides. Call your physician if vomiting continues.  Special Instructions/Symptoms: Your throat may feel dry or sore from the anesthesia or the breathing tube placed in your throat during surgery. If this causes discomfort, gargle with warm salt water. The discomfort should disappear within 24 hours.  If you had a scopolamine patch placed behind your ear for the management of post- operative nausea and/or vomiting:  1. The medication in the patch is effective for 72 hours, after which it should be removed.  Wrap patch in a tissue and discard in the trash. Wash hands thoroughly with soap and water. 2. You may remove the patch earlier than 72 hours if you experience unpleasant side effects which may include dry mouth, dizziness or visual disturbances. 3. Avoid touching the patch. Wash your hands with soap and water after contact with the patch.   DISCHARGE INSTRUCTIONS: D&C / D&E The following instructions have been prepared to help you care for yourself upon your return home.   Personal hygiene: . Use sanitary pads for vaginal drainage, not tampons. . Shower the day after your procedure. . NO tub baths, pools or Jacuzzis for 2-3 weeks. . Wipe front to back after using the bathroom.  Activity and limitations: . Do NOT drive or operate any equipment for 24 hours. The effects of anesthesia are  still present and drowsiness may result. . Do NOT rest in bed all day. . Walking is encouraged. . Walk up and down stairs slowly. . You may resume your normal activity in one to two days or as indicated by your physician.  Sexual activity: NO intercourse for at least 2 weeks after the procedure, or as indicated by your physician.  Diet: Eat a light meal as desired this evening. You may resume your usual diet tomorrow.  Return to work: You may resume your work activities in one to two days or as indicated by your doctor.  What to expect after your surgery: Expect to have vaginal bleeding/discharge for 2-3 days and spotting for up to 10 days. It is not unusual to have soreness for up to 1-2 weeks. You may have a slight burning sensation when you urinate for the first day. Mild cramps may continue for a couple of days. You may have a regular period in 2-6 weeks.  Call your doctor for any of the following: . Excessive vaginal bleeding, saturating and changing one pad every hour. . Inability to urinate 6 hours after discharge from hospital. . Pain not relieved by pain medication. . Fever of 100.4 F or greater. . Unusual vaginal discharge or odor.   Call for an appointment:      

## 2020-02-18 NOTE — Anesthesia Procedure Notes (Signed)
Procedure Name: Intubation Date/Time: 02/18/2020 2:34 PM Performed by: Bonney Aid, CRNA Pre-anesthesia Checklist: Patient identified, Emergency Drugs available, Suction available and Patient being monitored Patient Re-evaluated:Patient Re-evaluated prior to induction Oxygen Delivery Method: Circle system utilized Preoxygenation: Pre-oxygenation with 100% oxygen Induction Type: IV induction, Cricoid Pressure applied and Rapid sequence Ventilation: Mask ventilation without difficulty Laryngoscope Size: Mac and 3 Grade View: Grade I Tube type: Oral Tube size: 7.0 mm Number of attempts: 1 Airway Equipment and Method: Stylet and Oral airway Placement Confirmation: ETT inserted through vocal cords under direct vision,  positive ETCO2 and breath sounds checked- equal and bilateral Secured at: 21 cm Tube secured with: Tape Dental Injury: Teeth and Oropharynx as per pre-operative assessment

## 2020-02-18 NOTE — Transfer of Care (Signed)
Immediate Anesthesia Transfer of Care Note  Patient: ADALAYA IRION  Procedure(s) Performed: DILATATION & CURETTAGE/HYSTEROSCOPY WITH MYOSURE RESCTION OFENDOMETRIAL POLYPS (N/A Vagina )  Patient Location: PACU  Anesthesia Type:General  Level of Consciousness: awake, alert  and oriented, speaking french only  Airway & Oxygen Therapy: Patient Spontanous Breathing and Patient connected to nasal cannula oxygen  Post-op Assessment: Report given to RN  Post vital signs: Reviewed and stable  Last Vitals:  Vitals Value Taken Time  BP 93/70 02/18/20 1509  Temp    Pulse 95 02/18/20 1510  Resp 20 02/18/20 1510  SpO2 95 % 02/18/20 1510  Vitals shown include unvalidated device data.  Last Pain:  Vitals:   02/18/20 1332  TempSrc: Oral  PainSc: 3       Patients Stated Pain Goal: 3 (33/74/45 1460)  Complications: No complications documented.

## 2020-02-18 NOTE — Op Note (Signed)
NAMEJOZLYN, SCHATZ MEDICAL RECORD [id number] ACCOUNT [id number] DATE OF BIRTH:1966/04/27 FACILITY: [facility] LOCATION: [facility] PHYSICIAN:Nochum Fenter J. Ronita Hipps, MD  OPERATIVE REPORT  DATE OF PROCEDURE:  02/18/2020  PREOPERATIVE DIAGNOSIS:  Postmenopausal bleeding with structural lesion.  POSTOPERATIVE DIAGNOSES:   1.  Endometrial polyps x2. 2.  Large endocervical polyp.  PROCEDURE:   1.  Diagnostic hysteroscopy.   2.  Dilatation and curettage. 3.  MyoSure resection of endometrial and endocervical polyp.  SURGEON:  Brien Few, MD  ASSISTANT:  None.  ANESTHESIA:  General.  ESTIMATED BLOOD LOSS:  20 mL.  COMPLICATIONS:  None.  FLUID DEFICIT:  160 mL.  COUNTS:  Correct.  DISPOSITION:  The patient was taken to recovery in good condition.  BRIEF OPERATIVE NOTE:  After being apprised of the risks of anesthesia, infection, bleeding, and surrounding organs, possible need for repair, delayed versus immediate complications to include  bowel and bladder, internal vessel injury, possible need for  repair, the patient was brought to the operating room after consent was signed prepped and draped in usual sterile fashion, catheterized until the bladder was empty.  Exam under anesthesia reveals a bulky anteflexed uterus and no adnexal masses.  At  this time, dilute Marcaine solution placed, standard paracervical block.  Dilute Pitressin solution placed at 3 and 9 o'clock, 20 mL total.  At this time, cervix easily dilated up after use of an os finder to a [employer] dilator.  Hysteroscope placed.   Visualization reveals a sessile posterior endocervical wall polyp and 2 posterior wall endometrial polyps.  These were all resected using the MyoSure Reach device, which was entered without difficulty.  Minimal bleeding was noted.  D and C, sharp  curettage was performed in a 4-quadrant method.  EMC and polyps were sent to pathology for  final pathology permanent sectioning.  At this time,  normal tubal ostia, no other endometrial defects were noted.  Hysteroscope removed.   The patient tolerated  the procedure well, was awakened and transferred to recovery in good condition.  VN/NUANCE  D:02/18/2020 T:02/18/2020 JOB:[id number]

## 2020-02-18 NOTE — Anesthesia Postprocedure Evaluation (Signed)
Anesthesia Post Note  Patient: Kimberly Berry  Procedure(s) Performed: DILATATION & CURETTAGE/HYSTEROSCOPY WITH MYOSURE RESCTION OF ENDOMETRIAL POLYPS (N/A Vagina )     Patient location during evaluation: PACU Anesthesia Type: General Level of consciousness: awake Pain management: pain level controlled Vital Signs Assessment: post-procedure vital signs reviewed and stable Respiratory status: spontaneous breathing and respiratory function stable Cardiovascular status: stable Postop Assessment: no apparent nausea or vomiting Anesthetic complications: no   No complications documented.  Last Vitals:  Vitals:   02/18/20 1552 02/18/20 1645  BP:  121/83  Pulse: 67 69  Resp: 15 18  Temp: (!) 36.3 C 36.4 C  SpO2: 100% 98%                  Merlinda Frederick

## 2020-02-18 NOTE — Op Note (Signed)
02/18/2020  3:00 PM  PATIENT:  Kimberly Berry  54 y.o. female  PRE-OPERATIVE DIAGNOSIS:  Postmenopausal Bleeding  POST-OPERATIVE DIAGNOSIS:  Postmenopausal Bleeding  PROCEDURE:  Procedure(s): DILATATION & CURETTAGE HYSTEROSCOPY WITH MYOSURE RESECTION OF ENDOMETRIAL POLYPS AND ENDOCERVICAL POLYP  SURGEON:  Surgeon(s): Brien Few, MD  ASSISTANTS: none   ANESTHESIA:   local and general  ESTIMATED BLOOD LOSS: 20 mL   DRAINS: none   LOCAL MEDICATIONS USED:  MARCAINE    and Amount: 20 ml  SPECIMEN:  Source of Specimen:  EMC AND POLYPS  DISPOSITION OF SPECIMEN:  PATHOLOGY  COUNTS:  YES  DICTATION #: [id number]  PLAN OF CARE: DC HOME  PATIENT DISPOSITION:  PACU - hemodynamically stable.

## 2020-02-18 NOTE — Progress Notes (Signed)
Patient seen and examined. Consent witnessed and signed. No changes noted. Update completed. BP 118/76    Pulse 90    Temp 98.5 F (36.9 C) (Oral)    Resp 18    Ht 5' 4.25" (1.632 m)    Wt 101.5 kg    LMP 06/05/2017    SpO2 100%    BMI 38.10 kg/m   CBC    Component Value Date/Time   WBC 8.0 02/18/2020 1330   RBC 4.54 02/18/2020 1330   HGB 12.8 02/18/2020 1330   HGB 12.5 04/16/2019 1124   HCT 38.4 02/18/2020 1330   HCT 39.5 04/16/2019 1124   PLT 314 02/18/2020 1330   PLT 232 04/16/2019 1124   MCV 84.6 02/18/2020 1330   MCV 85 04/16/2019 1124   MCH 28.2 02/18/2020 1330   MCHC 33.3 02/18/2020 1330   RDW 13.2 02/18/2020 1330   RDW 13.4 04/16/2019 1124

## 2020-02-18 NOTE — Anesthesia Preprocedure Evaluation (Addendum)
Anesthesia Evaluation  Patient identified by MRN, date of birth, ID band  Reviewed: Allergy & Precautions, NPO status , Patient's Chart, lab work & pertinent test results  Airway Mallampati: I  TM Distance: >3 FB Neck ROM: Full    Dental no notable dental hx.    Pulmonary neg pulmonary ROS,    Pulmonary exam normal breath sounds clear to auscultation       Cardiovascular Exercise Tolerance: Good negative cardio ROS Normal cardiovascular exam Rhythm:Regular Rate:Normal     Neuro/Psych negative neurological ROS  negative psych ROS   GI/Hepatic negative GI ROS, Neg liver ROS,   Endo/Other  Obesity BMI 38  Renal/GU negative Renal ROS  negative genitourinary   Musculoskeletal Chronic back pain scoliosis   Abdominal   Peds negative pediatric ROS (+)  Hematology  (+) Sickle cell trait ,   Anesthesia Other Findings   Reproductive/Obstetrics negative OB ROS                            Anesthesia Physical Anesthesia Plan  ASA: II  Anesthesia Plan: MAC   Post-op Pain Management:    Induction:   PONV Risk Score and Plan: 2 and Propofol infusion, TIVA, Ondansetron and Treatment may vary due to age or medical condition  Airway Management Planned: Natural Airway and Simple Face Mask  Additional Equipment: None  Intra-op Plan:   Post-operative Plan:   Informed Consent: I have reviewed the patients History and Physical, chart, labs and discussed the procedure including the risks, benefits and alternatives for the proposed anesthesia with the patient or authorized representative who has indicated his/her understanding and acceptance.     Dental advisory given  Plan Discussed with: Anesthesiologist, CRNA and Surgeon  Anesthesia Plan Comments:         Anesthesia Quick Evaluation

## 2020-02-19 ENCOUNTER — Encounter (HOSPITAL_BASED_OUTPATIENT_CLINIC_OR_DEPARTMENT_OTHER): Payer: Self-pay | Admitting: Obstetrics and Gynecology

## 2020-02-19 LAB — SURGICAL PATHOLOGY

## 2020-04-27 ENCOUNTER — Encounter: Payer: Self-pay | Admitting: Internal Medicine

## 2020-05-10 ENCOUNTER — Encounter: Payer: Self-pay | Admitting: Internal Medicine

## 2020-10-15 ENCOUNTER — Other Ambulatory Visit: Payer: Self-pay

## 2020-10-15 ENCOUNTER — Emergency Department (HOSPITAL_COMMUNITY): Payer: 59

## 2020-10-15 ENCOUNTER — Encounter (HOSPITAL_COMMUNITY): Payer: Self-pay

## 2020-10-15 ENCOUNTER — Emergency Department (HOSPITAL_COMMUNITY)
Admission: EM | Admit: 2020-10-15 | Discharge: 2020-10-16 | Disposition: A | Discharge status: Home / Self Care | Payer: 59 | Attending: Emergency Medicine | Admitting: Emergency Medicine

## 2020-10-15 ENCOUNTER — Ambulatory Visit (HOSPITAL_COMMUNITY)
Admission: EM | Admit: 2020-10-15 | Discharge: 2020-10-15 | Disposition: A | Discharge status: Home / Self Care | Payer: 59 | Attending: Emergency Medicine | Admitting: Emergency Medicine

## 2020-10-15 DIAGNOSIS — R112 Nausea with vomiting, unspecified: Secondary | ICD-10-CM | POA: Insufficient documentation

## 2020-10-15 DIAGNOSIS — R4182 Altered mental status, unspecified: Secondary | ICD-10-CM

## 2020-10-15 DIAGNOSIS — R42 Dizziness and giddiness: Secondary | ICD-10-CM | POA: Insufficient documentation

## 2020-10-15 DIAGNOSIS — Z20822 Contact with and (suspected) exposure to covid-19: Secondary | ICD-10-CM | POA: Diagnosis not present

## 2020-10-15 DIAGNOSIS — R1011 Right upper quadrant pain: Secondary | ICD-10-CM | POA: Diagnosis present

## 2020-10-15 LAB — COMPREHENSIVE METABOLIC PANEL
ALT: 18 U/L (ref 0–44)
AST: 19 U/L (ref 15–41)
Albumin: 4.1 g/dL (ref 3.5–5.0)
Alkaline Phosphatase: 82 U/L (ref 38–126)
Anion gap: 10 (ref 5–15)
BUN: 12 mg/dL (ref 6–20)
CO2: 24 mmol/L (ref 22–32)
Calcium: 9.6 mg/dL (ref 8.9–10.3)
Chloride: 104 mmol/L (ref 98–111)
Creatinine, Ser: 0.78 mg/dL (ref 0.44–1.00)
GFR, Estimated: >60 mL/min (ref >60)
Glucose, Bld: 82 mg/dL (ref 70–99)
Potassium: 3.9 mmol/L (ref 3.5–5.1)
Sodium: 138 mmol/L (ref 135–145)
Total Bilirubin: 0.7 mg/dL (ref 0.3–1.2)
Total Protein: 7.8 g/dL (ref 6.5–8.1)

## 2020-10-15 LAB — CBC WITH DIFFERENTIAL/PLATELET
Abs Immature Granulocytes: 0.03 10*3/uL (ref 0.00–0.07)
Basophils Absolute: 0.1 10*3/uL (ref 0.0–0.1)
Basophils Relative: 1 %
Eosinophils Absolute: 0.3 10*3/uL (ref 0.0–0.5)
Eosinophils Relative: 4 %
HCT: 38.7 % (ref 36.0–46.0)
Hemoglobin: 12.7 g/dL (ref 12.0–15.0)
Immature Granulocytes: 0 %
Lymphocytes Relative: 18 %
Lymphs Abs: 1.3 10*3/uL (ref 0.7–4.0)
MCH: 27.8 pg (ref 26.0–34.0)
MCHC: 32.8 g/dL (ref 30.0–36.0)
MCV: 84.7 fL (ref 80.0–100.0)
Monocytes Absolute: 0.6 10*3/uL (ref 0.1–1.0)
Monocytes Relative: 9 %
Neutro Abs: 4.7 10*3/uL (ref 1.7–7.7)
Neutrophils Relative %: 68 %
Platelets: 246 10*3/uL (ref 150–400)
RBC: 4.57 MIL/uL (ref 3.87–5.11)
RDW: 13 % (ref 11.5–15.5)
WBC: 7 10*3/uL (ref 4.0–10.5)
nRBC: 0 % (ref 0.0–0.2)

## 2020-10-15 LAB — PROTIME-INR
INR: 1 (ref 0.8–1.2)
Prothrombin Time: 12.9 seconds (ref 11.4–15.2)

## 2020-10-15 LAB — CBG MONITORING, ED: Glucose-Capillary: 82 mg/dL (ref 70–99)

## 2020-10-15 LAB — LIPASE, BLOOD: Lipase: 27 U/L (ref 11–51)

## 2020-10-15 LAB — RESP PANEL BY RT-PCR (FLU A&B, COVID) ARPGX2
Influenza A by PCR: NEGATIVE
Influenza B by PCR: NEGATIVE
SARS Coronavirus 2 by RT PCR: NEGATIVE

## 2020-10-15 LAB — RAPID URINE DRUG SCREEN, HOSP PERFORMED
Amphetamines: NOT DETECTED
Barbiturates: NOT DETECTED
Benzodiazepines: NOT DETECTED
Cocaine: NOT DETECTED
Opiates: NOT DETECTED
Tetrahydrocannabinol: NOT DETECTED

## 2020-10-15 LAB — ETHANOL: Alcohol, Ethyl (B): <10 mg/dL (ref <10)

## 2020-10-15 MED ORDER — ONDANSETRON HCL 4 MG/2ML IJ SOLN
4.0000 mg | Freq: Once | INTRAMUSCULAR | Status: AC
  Administered 2020-10-15: 4 mg via INTRAVENOUS
  Filled 2020-10-15: qty 2

## 2020-10-15 MED ORDER — SODIUM CHLORIDE 0.9 % IV BOLUS
500.0000 mL | Freq: Once | INTRAVENOUS | Status: AC
  Administered 2020-10-15: 500 mL via INTRAVENOUS

## 2020-10-15 MED ORDER — MECLIZINE HCL 25 MG PO TABS
25.0000 mg | ORAL_TABLET | Freq: Once | ORAL | Status: AC
  Administered 2020-10-15: 25 mg via ORAL
  Filled 2020-10-15: qty 1

## 2020-10-15 MED ORDER — ONDANSETRON HCL 4 MG/2ML IJ SOLN
INTRAMUSCULAR | Status: AC
  Filled 2020-10-15: qty 2

## 2020-10-15 MED ORDER — FENTANYL CITRATE (PF) 100 MCG/2ML IJ SOLN
25.0000 ug | Freq: Once | INTRAMUSCULAR | Status: AC
  Administered 2020-10-15: 25 ug via INTRAVENOUS
  Filled 2020-10-15: qty 2

## 2020-10-15 MED ORDER — DIAZEPAM 5 MG/ML IJ SOLN
5.0000 mg | Freq: Once | INTRAMUSCULAR | Status: AC
  Administered 2020-10-15: 5 mg via INTRAVENOUS
  Filled 2020-10-15: qty 2

## 2020-10-15 NOTE — ED Notes (Signed)
Resting at this time.  Awaiting MRI

## 2020-10-15 NOTE — Discharge Instructions (Signed)
Transported to ED via EMS

## 2020-10-15 NOTE — ED Notes (Signed)
Transported to MRI for 2nd attempt

## 2020-10-15 NOTE — ED Provider Notes (Signed)
[facility] EMERGENCY DEPARTMENT Provider Note   CSN: [id number] Arrival date & time: 10/15/20  1523     History dizzy   Kimberly Berry is a 55 y.o. female presenting from urgent care for evaluation of dizziness and abdominal pain.  Patient states she began having right upper quadrant abdominal pain a week that radiates to her right shoulder blade.  She had cholecystectomy about 4 years ago.  She mostly complains of dizziness that has been coming and going.  Initially began having symptoms on Tuesday and then resolved.  Symptoms returned last night.  She has room spinning dizziness with position change of her head.  Symptoms are improved with rest.  She has some associated nausea.  Was concern for expressive aphasia.  Patient's husband arrived and provides additional history.  States she has been having difficulty expressing herself for 2 years now.  This is gradually worsening over the last few months, nothing acute.  He states it is normal for her to take an extended period of time to explain a simple task.  States this happened about 2 years ago when she was having a difficult time emotionally.  Does not believe this was a sudden change though has been persisting over the last 2 years.  She has never been evaluated for this.  Patient denies unilateral numbness or weakness.  Does endorse generalized fatigue, dry cough, bitter taste in her mouth, dry mouth.   Patient states she grew up in a bilingual household, speaking [place], [place], [place].  Sometimes she mixes up her languages when speaking.  The history is provided by the patient and the spouse.       Past Medical History:  Diagnosis Date  . Anemia yrs ago   hx IDA  . Chronic back pain   . Idiopathic scoliosis   . PMB (postmenopausal bleeding) 07/2019   x 1  . Sickle cell trait [facility])     Patient Active Problem List   Diagnosis Date Noted  . Iron deficiency anemia 04/23/2018  . Obesity  04/23/2018  . Cervicalgia 04/02/2018    Past Surgical History:  Procedure Laterality Date  . CESAREAN SECTION  2000  . CHOLECYSTECTOMY     2006  . colonscopy  12/31/2019    2 polyps removed  recheck  in 10 yrs  . DILATATION & CURETTAGE/HYSTEROSCOPY WITH MYOSURE N/A 02/18/2020   Procedure: DILATATION & CURETTAGE/HYSTEROSCOPY WITH MYOSURE RESCTION OF ENDOMETRIAL POLYPS;  Surgeon: Brien Few, MD;  Location: [facility];  Service: Gynecology;  Laterality: N/A;  . SPINAL FUSION  1995   T3-L2. IDIOPATHIC SCOLIOSIS  . WISDOM TOOTH EXTRACTION  2003     OB History   No obstetric history on file.     Family History  Problem Relation Age of Onset  . Dementia Father   . Colon polyps Neg Hx   . Colon cancer Neg Hx   . Esophageal cancer Neg Hx   . Stomach cancer Neg Hx   . Rectal cancer Neg Hx     Social History   Tobacco Use  . Smoking status: Never Smoker  . Smokeless tobacco: Never Used  Vaping Use  . Vaping Use: Never used  Substance Use Topics  . Alcohol use: Not Currently  . Drug use: No    Home Medications Prior to Admission medications   Medication Sig Start Date End Date Taking? Authorizing Provider  Acetaminophen (TYLENOL PO) Take by mouth. Patient not taking: No sig reported  [provider]  aspirin EC 81 MG tablet Take 81 mg by mouth every 4 (four) hours as needed. Swallow whole.  Patient not taking: Reported on 10/15/2020    [provider]  b complex vitamins tablet Take 1 tablet by mouth daily. b 6 b 12 and folic acid combination 1 pill per day Patient not taking: Reported on 10/15/2020    [provider]  cholecalciferol (VITAMIN D3) 25 MCG (1000 UT) tablet Take 1,000 Units by mouth daily. Patient not taking: No sig reported    [provider]  ibuprofen (ADVIL,MOTRIN) 200 MG tablet Take 200 mg by mouth every 6 (six) hours as needed. Patient not taking: No sig reported    [provider]   Multiple Vitamin (MULTIVITAMIN) capsule Take 1 capsule by mouth daily. One a day womens complete Patient not taking: Reported on 10/15/2020    [provider]  Naproxen Sodium (ALEVE PO) Take by mouth. Patient not taking: Reported on 10/15/2020    [provider]  traMADol (ULTRAM) 50 MG tablet Take 1-2 tablets (50-100 mg total) by mouth every 6 (six) hours as needed. Patient not taking: Reported on 10/15/2020 02/18/20   Brien Few, MD  UNABLE TO FIND Calcium 600 mg daily Patient not taking: Reported on 10/15/2020    [provider]    Allergies    Eggs or egg-derived products and Shrimp [shellfish allergy]  Review of Systems   Review of Systems  Eyes: Negative for visual disturbance.  Respiratory: Positive for cough.   Gastrointestinal: Positive for abdominal pain and nausea.  Neurological: Positive for dizziness and speech difficulty. Negative for headaches.  All other systems reviewed and are negative.   Physical Exam Updated Vital Signs BP (!) 110/58   Pulse 76   Temp (!) 97.2 F (36.2 C) (Oral)   Resp 16   Ht 5\' 4"  (1.626 m)   Wt 101.5 kg   LMP  (LMP Unknown) Comment: Feb 2021  SpO2 99%   BMI 38.41 kg/m   Physical Exam Vitals and nursing note reviewed.  Constitutional:      Appearance: She is well-developed.  HENT:     Head: Normocephalic and atraumatic.  Eyes:     Extraocular Movements: Extraocular movements intact.     Conjunctiva/sclera: Conjunctivae normal.     Pupils: Pupils are equal, round, and reactive to light.  Cardiovascular:     Rate and Rhythm: Normal rate and regular rhythm.     Pulses: Normal pulses.  Pulmonary:     Effort: Pulmonary effort is normal. No respiratory distress.     Breath sounds: Normal breath sounds.  Abdominal:     General: Bowel sounds are normal.     Palpations: Abdomen is soft.     Tenderness: There is abdominal tenderness in the right upper quadrant. There is no guarding or rebound.   Musculoskeletal:     Right lower leg: No edema.     Left lower leg: No edema.  Skin:    General: Skin is warm.  Neurological:     Mental Status: She is alert.     Comments: Mental Status:  Alert, oriented, thought content appropriate, able to give a coherent history. Speech is delayed. She is able to name objects though takes about 5-10 seconds to produce the name. Questions commands multiple times before carrying out command.  Cranial Nerves:  II:  Peripheral visual fields grossly normal, pupils equal, round, reactive to light III,IV, VI: ptosis not present, extra-ocular motions  intact bilaterally  V,VII: smile symmetric, facial light touch sensation equal VIII: hearing grossly normal to voice  X: uvula elevates symmetrically  XI: bilateral shoulder shrug symmetric and strong XII: midline tongue extension without fassiculations Motor:  Normal tone. 4/5 strength in upper and lower extremities bilaterally including grip strength and dorsiflexion/plantar flexion Sensory: grossly normal in all extremities.  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     ED Results / Procedures / Treatments   Labs (all labs ordered are listed, but only abnormal results are displayed) Labs Reviewed  RESP PANEL BY RT-PCR (FLU A&B, COVID) ARPGX2  COMPREHENSIVE METABOLIC PANEL  CBC WITH DIFFERENTIAL/PLATELET  LIPASE, BLOOD  PROTIME-INR  ETHANOL  RAPID URINE DRUG SCREEN, HOSP PERFORMED    EKG EKG Interpretation  Date/Time:  Friday Oct 15 2020 15:26:47 EDT Ventricular Rate:  89 PR Interval:  147 QRS Duration: 79 QT Interval:  335 QTC Calculation: 408 R Axis:   36 Text Interpretation: Sinus rhythm Confirmed by Thamas Jaegers ([id number]) on 10/15/2020 11:53:33 PM   Radiology CT Head Wo Contrast  Result Date: 10/15/2020 CLINICAL  DATA:  Dizziness EXAM: CT HEAD WITHOUT CONTRAST TECHNIQUE: Contiguous axial images were obtained from the base of the skull through the vertex without intravenous contrast. COMPARISON:  None. FINDINGS: Brain: No evidence of acute infarction, hemorrhage, hydrocephalus, extra-axial collection or mass lesion/mass effect. Vascular: No hyperdense vessel or unexpected calcification. Skull: Normal. Negative for fracture or focal lesion. Sinuses/Orbits: No acute finding. Other: None. IMPRESSION: No acute intracranial abnormality. Electronically Signed   By: Valentino Saxon MD   On: 10/15/2020 16:31   US Abdomen Limited RUQ (LIVER/GB)  Result Date: 10/15/2020 CLINICAL DATA:  Upper abdominal pain EXAM: ULTRASOUND ABDOMEN LIMITED RIGHT UPPER QUADRANT COMPARISON:  Abdominal ultrasound April 21, 2005 FINDINGS: Gallbladder: Surgically absent. Common bile duct: Diameter: 6 mm. No intrahepatic or extrahepatic biliary duct dilatation. Liver: No focal lesion identified. Within normal limits in parenchymal echogenicity. Portal vein is patent on color Doppler imaging with normal direction of blood flow towards the liver. Other: None. IMPRESSION: Gallbladder absent.  Study otherwise unremarkable. Electronically Signed   By: Lowella Grip III M.D.   On: 10/15/2020 16:49    Procedures Procedures   Medications Ordered in ED Medications  ondansetron (ZOFRAN) injection 4 mg (4 mg Intravenous Patient Refused/Not Given 10/15/20 1822)  meclizine (ANTIVERT) tablet 25 mg (25 mg Oral Given 10/15/20 1717)  fentaNYL (SUBLIMAZE) injection 25 mcg (25 mcg Intravenous Given 10/15/20 1717)  sodium chloride 0.9 % bolus 500 mL (0 mLs Intravenous Stopped 10/15/20 1822)  diazepam (VALIUM) injection 5 mg (5 mg Intravenous Given 10/15/20 2047)    ED Course  I have reviewed the triage vital signs and the nursing notes.  Pertinent labs & imaging results that were available during my care of the patient were reviewed by me and  considered in my medical decision making (see chart for details).    MDM Rules/Calculators/A&P                          Patient here with positional vertigo with associated nausea.  Symptoms are relieved with rest.  She does however have abnormal neurologic exam.  She has some expressive aphasia and some difficulty following simple commands.  Husband reports this is chronic over the last 2 years.  No acute changes in this.  No new headache or vision change. No paresthesia or weakness. Also had some right upper quadrant abdominal pain status postcholecystectomy.  Right upper quadrant ultrasound was obtained does not show any retained stone in the duct.  Considered dissection, however location of pain is not consistent.  She not having any chest pain or left-sided abdominal pain.  No tenderness to left abdomen.  Blood pressures in pulses are equal to bilateral upper extremities.  Discussed work-up with attending physician Dr. Almyra Free.  Will obtain CT and MRI of the brain.  Treat vertiginous symptoms.  Blood work is overall unremarkable with normal blood counts, normal metabolic panel and coags.  UDS is negative, COVID swab is negative, ethanol is negative.  Meclizine did not provide adequate relief of symptoms, and patient began vomiting on MRI scanner.  She is treated with Valium with improvement and tolerated MRI well. Plan to follow MRI, ambulate. If negative, discharge with symptomatic treatment for vertigo, close outpatient follow up. Return precautions. Dr. Leonette Monarch to follow MRI result.  Final Clinical Impression(s) / ED Diagnoses Final diagnoses:  RUQ abdominal pain    Rx / DC Orders ED Discharge Orders    None       Gertie Broerman, [place] N, PA-C 10/16/20 0030    Fatima Blank, MD 10/16/20 [phone]

## 2020-10-15 NOTE — ED Provider Notes (Signed)
[place]    CSN: [id number] Arrival date & time: 10/15/20  1233      History   Chief Complaint Chief Complaint  Patient presents with  . Dizziness  . Nausea  . Abdominal Pain    HPI Kimberly Berry is a 55 y.o. female.   Patient here for evaluation of dizziness and abdominal pain.  Reports symptoms started several days ago but worsened last night.  Reports dizziness intermittent but frequent.  During evaluation patient frequently will have to close eyes and hold onto the arms of the wheelchair to "steady herself".  Reports dizziness comes in waves and is worsened with position changes but also occurs periodically when she is just standing or walking.  Staff at the front reported that patient was extremely unsteady and had to be placed in a wheelchair to prevent a fall.  Thought process was fairly circumferential and it was difficult to get a good history of present illness.  The history is provided by the patient.  Dizziness Abdominal Pain   Past Medical History:  Diagnosis Date  . Anemia yrs ago   hx IDA  . Chronic back pain   . Idiopathic scoliosis   . PMB (postmenopausal bleeding) 07/2019   x 1  . Sickle cell trait [facility])     Patient Active Problem List   Diagnosis Date Noted  . Iron deficiency anemia 04/23/2018  . Obesity 04/23/2018  . Cervicalgia 04/02/2018    Past Surgical History:  Procedure Laterality Date  . CESAREAN SECTION  2000  . CHOLECYSTECTOMY     2006  . colonscopy  12/31/2019    2 polyps removed  recheck  in 10 yrs  . DILATATION & CURETTAGE/HYSTEROSCOPY WITH MYOSURE N/A 02/18/2020   Procedure: DILATATION & CURETTAGE/HYSTEROSCOPY WITH MYOSURE RESCTION OF ENDOMETRIAL POLYPS;  Surgeon: Brien Few, MD;  Location: [place];  Service: Gynecology;  Laterality: N/A;  . SPINAL FUSION  1995   T3-L2. IDIOPATHIC SCOLIOSIS  . WISDOM TOOTH EXTRACTION  2003    OB History   No obstetric history on file.      Home  Medications    Prior to Admission medications   Medication Sig Start Date End Date Taking? Authorizing Provider  Acetaminophen (TYLENOL PO) Take by mouth. Patient not taking: Reported on 12/17/2019    [provider]  aspirin EC 81 MG tablet Take 81 mg by mouth every 4 (four) hours as needed. Swallow whole.     [provider]  b complex vitamins tablet Take 1 tablet by mouth daily. b 6 b 12 and folic acid combination 1 pill per day    [provider]  cholecalciferol (VITAMIN D3) 25 MCG (1000 UT) tablet Take 1,000 Units by mouth daily. Patient not taking: Reported on 12/17/2019    [provider]  ibuprofen (ADVIL,MOTRIN) 200 MG tablet Take 200 mg by mouth every 6 (six) hours as needed. Patient not taking: Reported on 12/17/2019    [provider]  Multiple Vitamin (MULTIVITAMIN) capsule Take 1 capsule by mouth daily. One a day womens complete    [provider]  Naproxen Sodium (ALEVE PO) Take by mouth.    [provider]  traMADol (ULTRAM) 50 MG tablet Take 1-2 tablets (50-100 mg total) by mouth every 6 (six) hours as needed. 02/18/20   Brien Few, MD  UNABLE TO FIND Calcium 600 mg daily    [provider]    Family History Family History  Problem Relation  Age of Onset  . Dementia Father   . Colon polyps Neg Hx   . Colon cancer Neg Hx   . Esophageal cancer Neg Hx   . Stomach cancer Neg Hx   . Rectal cancer Neg Hx     Social History Social History   Tobacco Use  . Smoking status: Never Smoker  . Smokeless tobacco: Never Used  Vaping Use  . Vaping Use: Never used  Substance Use Topics  . Alcohol use: Not Currently  . Drug use: No     Allergies   Eggs or egg-derived products and Shrimp [shellfish allergy]   Review of Systems Review of Systems  Gastrointestinal: Positive for abdominal pain.  Neurological: Positive for dizziness and light-headedness.     Physical Exam Triage Vital Signs ED  Triage Vitals  Enc Vitals Group     BP 10/15/20 1343 128/85     Pulse Rate 10/15/20 1343 99     Resp 10/15/20 1343 16     Temp 10/15/20 1343 98.2 F (36.8 C)     Temp Source 10/15/20 1343 Oral     SpO2 10/15/20 1343 98 %     Weight --      Height --      Head Circumference --      Peak Flow --      Pain Score 10/15/20 1345 6     Pain Loc --      Pain Edu? --      Excl. in [place]? --    No data found.  Updated Vital Signs BP 128/85 (BP Location: Right Arm)   Pulse 99   Temp 98.2 F (36.8 C) (Oral)   Resp 16   LMP  (LMP Unknown) Comment: Feb 2021  SpO2 98%   Visual Acuity Right Eye Distance:   Left Eye Distance:   Bilateral Distance:    Right Eye Near:   Left Eye Near:    Bilateral Near:     Physical Exam Vitals and nursing note reviewed.  Constitutional:      General: She is not in acute distress.    Appearance: Normal appearance. She is not ill-appearing, toxic-appearing or diaphoretic.  HENT:     Head: Normocephalic and atraumatic.  Eyes:     Conjunctiva/sclera: Conjunctivae normal.  Cardiovascular:     Rate and Rhythm: Normal rate.     Pulses: Normal pulses.  Pulmonary:     Effort: Pulmonary effort is normal.  Abdominal:     General: Abdomen is flat.  Musculoskeletal:        General: Normal range of motion.     Cervical back: Normal range of motion.  Skin:    General: Skin is warm and dry.  Neurological:     General: No focal deficit present.     Mental Status: She is alert and oriented to person, place, and time.     Cranial Nerves: No facial asymmetry.     Sensory: Sensation is intact.     Coordination: Finger-Nose-Finger Test abnormal.     Gait: Gait abnormal.     Comments: Difficult to obtain a good HPI as patient appeared to have a hard time finding words and a circumferential thought process.    Psychiatric:        Mood and Affect: Mood normal.      UC Treatments / Results  Labs (all labs ordered are listed, but only abnormal results are  displayed) Labs Reviewed  CBG MONITORING, ED  EKG   Radiology No results found.  Procedures Procedures (including critical care time)  Medications Ordered in UC Medications - No data to display  Initial Impression / Assessment and Plan / UC Course  I have reviewed the triage vital signs and the nursing notes.  Pertinent labs & imaging results that were available during my care of the patient were reviewed by me and considered in my medical decision making (see chart for details).    Assessment concerning for possible neurological process and sent to the emergency room for further evaluation at a higher level of care. Final Clinical Impressions(s) / UC Diagnoses   Final diagnoses:  Dizziness  Altered mental status, unspecified altered mental status type     Discharge Instructions     Transported to ED via EMS    ED Prescriptions    None     PDMP not reviewed this encounter.   Pearson Forster, NP 10/15/20 1452

## 2020-10-15 NOTE — ED Notes (Signed)
Patient requesting her husband go into MRI with her and RN advised that is not able to happen.  Explained the valium will make her sleepy and help with anxiety.

## 2020-10-15 NOTE — ED Triage Notes (Signed)
Pt presents with dizziness, nausea, and right side abdominal pain that radiates around to right flank area for about one week.

## 2020-10-15 NOTE — ED Notes (Signed)
Patient is being discharged from the Urgent Care and sent to the Emergency Department via EMS. Per Caroll Rancher, NP, patient is in need of higher level of care due to dizziness, weakness, &confusion. Patient is aware and verbalizes understanding of plan of care.  Vitals:   10/15/20 1343  BP: 128/85  Pulse: 99  Resp: 16  Temp: 98.2 F (36.8 C)  SpO2: 98%

## 2020-10-15 NOTE — ED Notes (Signed)
Reports dizziness is better but still not sleepy.

## 2020-10-15 NOTE — ED Notes (Signed)
Up to bedside commode

## 2020-10-15 NOTE — ED Triage Notes (Signed)
Patient presents form Urgent Care via EMS for expressive aphasia, dizziness, impaired gait, nausea, right sided abdominal pain that radiates to the right shoulder and back. Unknown last known well time. Husband should be on the way

## 2020-10-16 MED ORDER — MECLIZINE HCL 12.5 MG PO TABS: 12.5000 mg | ORAL_TABLET | Freq: Three times a day (TID) | ORAL | 0 refills | Status: DC | PRN

## 2020-10-16 MED ORDER — ONDANSETRON 4 MG PO TBDP: 4.0000 mg | ORAL_TABLET | Freq: Three times a day (TID) | ORAL | 0 refills | Status: AC | PRN

## 2020-10-16 NOTE — ED Notes (Signed)
Pt ambulated in hallway. Pt had unsteady gait, pt was holding onto wall and complained of dizziness and headache.

## 2020-10-16 NOTE — ED Notes (Signed)
Patient resting at this time. Lights dimmed.

## 2020-10-16 NOTE — ED Notes (Signed)
Pt d/c at this time. Ride on the way.

## 2020-10-16 NOTE — ED Notes (Signed)
Patient sitting on stretcher with clothes on.

## 2020-10-16 NOTE — Discharge Instructions (Addendum)
Please follow closely with your primary care provider. Schedule an appointment with the neurologist regarding your visit today.  Return for persistent symptoms, new numbness or weakness in her extremities, new vision change, or other concerning symptoms.

## 2020-10-16 NOTE — ED Notes (Signed)
Pt ambulated to the bathroom with minimal assistance

## 2020-10-19 ENCOUNTER — Ambulatory Visit (INDEPENDENT_AMBULATORY_CARE_PROVIDER_SITE_OTHER): Payer: 59 | Admitting: Nurse Practitioner

## 2020-10-19 ENCOUNTER — Encounter: Payer: Self-pay | Admitting: Nurse Practitioner

## 2020-10-19 ENCOUNTER — Other Ambulatory Visit: Payer: Self-pay

## 2020-10-19 VITALS — Temp 98.7°F

## 2020-10-19 DIAGNOSIS — R42 Dizziness and giddiness: Secondary | ICD-10-CM | POA: Diagnosis not present

## 2020-10-19 DIAGNOSIS — R059 Cough, unspecified: Secondary | ICD-10-CM | POA: Diagnosis not present

## 2020-10-19 LAB — POC INFLUENZA A&B (BINAX/QUICKVUE)
Influenza A, POC: NEGATIVE
Influenza B, POC: NEGATIVE

## 2020-10-19 LAB — POC COVID19 BINAXNOW: SARS Coronavirus 2 Ag: NEGATIVE

## 2020-10-19 MED ORDER — PROMETHAZINE-DM 6.25-15 MG/5ML PO SYRP: 5.0000 mL | ORAL_SOLUTION | Freq: Four times a day (QID) | ORAL | 0 refills | Status: DC | PRN

## 2020-10-19 NOTE — Progress Notes (Signed)
This visit occurred during the SARS-CoV-2 public health emergency.  Safety protocols were in place, including screening questions prior to the visit, additional usage of staff PPE, and extensive cleaning of exam room while observing appropriate contact time as indicated for disinfecting solutions.  Subjective:     Patient ID: Kimberly Berry , female    DOB: 13-Aug-1965 , 55 y.o.   MRN: [id number]   Chief Complaint  Patient presents with  . Cough    HPI  This is a outside visit for a patient with cough. No congestion but has cough. She did have some phelgn but now its is better. No fever. The patient did go to the ED on Friday due to vertigo. No bodyache. The patient is still having some vertigo although she was prescribed some meclizine .The patient has not been taking anything for cough. The cough started on Friday. The rapid test for COVID and flu was negative in the office. The patient would also like to get a referral to ENT in regards to her vertigo.     Past Medical History:  Diagnosis Date  . Anemia yrs ago   hx IDA  . Chronic back pain   . Idiopathic scoliosis   . PMB (postmenopausal bleeding) 07/2019   x 1  . Sickle cell trait (HCC)      Family History  Problem Relation Age of Onset  . Dementia Father   . Colon polyps Neg Hx   . Colon cancer Neg Hx   . Esophageal cancer Neg Hx   . Stomach cancer Neg Hx   . Rectal cancer Neg Hx      Current Outpatient Medications:  .  promethazine-dextromethorphan (PROMETHAZINE-DM) 6.25-15 MG/5ML syrup, Take 5 mLs by mouth 4 (four) times daily as needed for cough., Disp: 118 mL, Rfl: 0 .  Acetaminophen (TYLENOL PO), Take by mouth. (Patient not taking: No sig reported), Disp: , Rfl:  .  aspirin EC 81 MG tablet, Take 81 mg by mouth every 4 (four) hours as needed. Swallow whole.  (Patient not taking: Reported on 10/15/2020), Disp: , Rfl:  .  b complex vitamins tablet, Take 1 tablet by mouth daily. b 6 b 12 and folic acid combination  1 pill per day (Patient not taking: Reported on 10/15/2020), Disp: , Rfl:  .  cholecalciferol (VITAMIN D3) 25 MCG (1000 UT) tablet, Take 1,000 Units by mouth daily. (Patient not taking: No sig reported), Disp: , Rfl:  .  ibuprofen (ADVIL,MOTRIN) 200 MG tablet, Take 200 mg by mouth every 6 (six) hours as needed. (Patient not taking: No sig reported), Disp: , Rfl:  .  meclizine (ANTIVERT) 12.5 MG tablet, Take 1 tablet (12.5 mg total) by mouth 3 (three) times daily as needed for dizziness., Disp: 30 tablet, Rfl: 0 .  Multiple Vitamin (MULTIVITAMIN) capsule, Take 1 capsule by mouth daily. One a day womens complete (Patient not taking: Reported on 10/15/2020), Disp: , Rfl:  .  Naproxen Sodium (ALEVE PO), Take by mouth. (Patient not taking: Reported on 10/15/2020), Disp: , Rfl:  .  ondansetron (ZOFRAN ODT) 4 MG disintegrating tablet, Take 1 tablet (4 mg total) by mouth every 8 (eight) hours as needed for up to 3 days for nausea or vomiting., Disp: 15 tablet, Rfl: 0 .  traMADol (ULTRAM) 50 MG tablet, Take 1-2 tablets (50-100 mg total) by mouth every 6 (six) hours as needed. (Patient not taking: Reported on 10/15/2020), Disp: 20 tablet, Rfl: 0 .  UNABLE TO FIND, Calcium 600  mg daily (Patient not taking: Reported on 10/15/2020), Disp: , Rfl:    Allergies  Allergen Reactions  . Eggs Or Egg-Derived Products     Vomiting, cramps, red hives  . Shrimp [Shellfish Allergy]     Difficulty breathing     Review of Systems  Constitutional: Negative for chills, fatigue and fever.  HENT: Positive for tinnitus. Negative for congestion and sinus pain.   Respiratory: Positive for cough. Negative for shortness of breath and wheezing.   Cardiovascular: Negative for chest pain and palpitations.  Gastrointestinal: Positive for nausea.  Musculoskeletal: Negative for arthralgias and myalgias.  Neurological: Positive for dizziness. Negative for weakness, numbness and headaches.     Today's Vitals   10/19/20 1243  Temp:  98.7 F (37.1 C)  TempSrc: Oral   There is no height or weight on file to calculate BMI.   Objective:  Physical Exam Constitutional:      Appearance: Normal appearance.  HENT:     Head: Normocephalic and atraumatic.     Right Ear: Tympanic membrane normal.     Left Ear: Tympanic membrane normal.  Cardiovascular:     Rate and Rhythm: Normal rate and regular rhythm.     Pulses: Normal pulses.     Heart sounds: Normal heart sounds. No murmur heard.   Pulmonary:     Effort: Pulmonary effort is normal. No respiratory distress.     Breath sounds: Normal breath sounds. No rales.  Skin:    General: Skin is warm and dry.     Capillary Refill: Capillary refill takes less than 2 seconds.  Neurological:     Mental Status: She is alert and oriented to person, place, and time.  Psychiatric:        Mood and Affect: Mood normal.         Assessment And Plan:     1. Cough - POC COVID-19- neg  - POC Influenza A&B (Binax test)- neg  - Novel Coronavirus, NAA ([facility]) - promethazine-dextromethorphan (PROMETHAZINE-DM) 6.25-15 MG/5ML syrup; Take 5 mLs by mouth 4 (four) times daily as needed for cough.  Dispense: 118 mL; Refill: 0 -Follow up for worsening or persistent symptoms.   2. Vertigo -Patient was seen in the ED on Friday. Patient requested referral to ENT.  - Ambulatory referral to ENT   Side effects and appropriate use of all the medication(s) were discussed with the patient today. Patient advised to use the medication(s) as directed by their healthcare provider. The patient was encouraged to read, review, and understand all associated package inserts and contact our office with any questions or concerns. The patient accepts the risks of the treatment plan and had an opportunity to ask questions.   The patient was encouraged to call or send a message through [place] for any questions or concerns.   Patient was given opportunity to ask questions. Patient verbalized understanding of  the plan and was able to repeat key elements of the plan. All questions were answered to their satisfaction.  Raman Merly Hinkson, DNP   I, Raman Triniti Gruetzmacher have reviewed all documentation for this visit. The documentation on 10/19/20 for the exam, diagnosis, procedures, and orders are all accurate and complete.     THE PATIENT IS ENCOURAGED TO PRACTICE SOCIAL DISTANCING DUE TO THE COVID-19 PANDEMIC.

## 2020-10-19 NOTE — Patient Instructions (Signed)

## 2020-10-19 NOTE — Progress Notes (Signed)
I,Annalynne Ibanez,acting as a Education administrator for [employer], NP.,have documented all relevant documentation on the behalf of [employer], NP,as directed by  Bary Castilla, NP while in the presence of Bary Castilla, NP.  This visit occurred during the SARS-CoV-2 public health emergency.  Safety protocols were in place, including screening questions prior to the visit, additional usage of staff PPE, and extensive cleaning of exam room while observing appropriate contact time as indicated for disinfecting solutions.  Subjective:     Patient ID: Kimberly Berry , female    DOB: 1966-05-13 , 55 y.o.   MRN: [id number]   Chief Complaint  Patient presents with  . Cough    HPI  Cough     Past Medical History:  Diagnosis Date  . Anemia yrs ago   hx IDA  . Chronic back pain   . Idiopathic scoliosis   . PMB (postmenopausal bleeding) 07/2019   x 1  . Sickle cell trait (HCC)      Family History  Problem Relation Age of Onset  . Dementia Father   . Colon polyps Neg Hx   . Colon cancer Neg Hx   . Esophageal cancer Neg Hx   . Stomach cancer Neg Hx   . Rectal cancer Neg Hx      Current Outpatient Medications:  .  Acetaminophen (TYLENOL PO), Take by mouth. (Patient not taking: No sig reported), Disp: , Rfl:  .  aspirin EC 81 MG tablet, Take 81 mg by mouth every 4 (four) hours as needed. Swallow whole.  (Patient not taking: Reported on 10/15/2020), Disp: , Rfl:  .  b complex vitamins tablet, Take 1 tablet by mouth daily. b 6 b 12 and folic acid combination 1 pill per day (Patient not taking: Reported on 10/15/2020), Disp: , Rfl:  .  cholecalciferol (VITAMIN D3) 25 MCG (1000 UT) tablet, Take 1,000 Units by mouth daily. (Patient not taking: No sig reported), Disp: , Rfl:  .  ibuprofen (ADVIL,MOTRIN) 200 MG tablet, Take 200 mg by mouth every 6 (six) hours as needed. (Patient not taking: No sig reported), Disp: , Rfl:  .  meclizine (ANTIVERT) 12.5 MG tablet, Take 1 tablet (12.5  mg total) by mouth 3 (three) times daily as needed for dizziness., Disp: 30 tablet, Rfl: 0 .  Multiple Vitamin (MULTIVITAMIN) capsule, Take 1 capsule by mouth daily. One a day womens complete (Patient not taking: Reported on 10/15/2020), Disp: , Rfl:  .  Naproxen Sodium (ALEVE PO), Take by mouth. (Patient not taking: Reported on 10/15/2020), Disp: , Rfl:  .  ondansetron (ZOFRAN ODT) 4 MG disintegrating tablet, Take 1 tablet (4 mg total) by mouth every 8 (eight) hours as needed for up to 3 days for nausea or vomiting., Disp: 15 tablet, Rfl: 0 .  traMADol (ULTRAM) 50 MG tablet, Take 1-2 tablets (50-100 mg total) by mouth every 6 (six) hours as needed. (Patient not taking: Reported on 10/15/2020), Disp: 20 tablet, Rfl: 0 .  UNABLE TO FIND, Calcium 600 mg daily (Patient not taking: Reported on 10/15/2020), Disp: , Rfl:    Allergies  Allergen Reactions  . Eggs Or Egg-Derived Products     Vomiting, cramps, red hives  . Shrimp [Shellfish Allergy]     Difficulty breathing     Review of Systems  Constitutional: Negative.   Respiratory: Positive for cough.   Cardiovascular: Negative.   Gastrointestinal: Negative.   Neurological: Negative.      Today's Vitals   10/19/20 1243  Temp: 98.7  F (37.1 C)  TempSrc: Oral   There is no height or weight on file to calculate BMI.   Objective:  Physical Exam      Assessment And Plan:     1. Cough - POC COVID-19 - POC Influenza A&B (Binax test) - Novel Coronavirus, NAA ([facility])     Patient was given opportunity to ask questions. Patient verbalized understanding of the plan and was able to repeat key elements of the plan. All questions were answered to their satisfaction.  Octavio Manns   I, Octavio Manns, have reviewed all documentation for this visit. The documentation on 10/19/20 for the exam, diagnosis, procedures, and orders are all accurate and complete.   IF YOU HAVE BEEN REFERRED TO A SPECIALIST, IT MAY TAKE 1-2 WEEKS TO  SCHEDULE/PROCESS THE REFERRAL. IF YOU HAVE NOT HEARD FROM US/SPECIALIST IN TWO WEEKS, PLEASE GIVE [place] A CALL AT [phone].   THE PATIENT IS ENCOURAGED TO PRACTICE SOCIAL DISTANCING DUE TO THE COVID-19 PANDEMIC.

## 2020-10-20 ENCOUNTER — Other Ambulatory Visit: Payer: Self-pay | Admitting: Nurse Practitioner

## 2020-10-20 DIAGNOSIS — U071 COVID-19: Secondary | ICD-10-CM

## 2020-10-20 LAB — NOVEL CORONAVIRUS, NAA: SARS-CoV-2, NAA: DETECTED — AB

## 2020-10-20 LAB — SARS-COV-2, NAA 2 DAY TAT

## 2020-10-20 MED ORDER — AZITHROMYCIN 250 MG PO TABS
ORAL_TABLET | ORAL | 0 refills | Status: AC
Start: 2020-10-20 — End: 2020-10-25

## 2020-11-09 ENCOUNTER — Other Ambulatory Visit: Payer: Self-pay

## 2020-11-09 ENCOUNTER — Ambulatory Visit (INDEPENDENT_AMBULATORY_CARE_PROVIDER_SITE_OTHER): Payer: 59 | Admitting: Internal Medicine

## 2020-11-09 ENCOUNTER — Encounter: Payer: Self-pay | Admitting: Internal Medicine

## 2020-11-09 VITALS — BP 116/74 | HR 93 | Temp 98.3°F | Ht 63.4 in | Wt 222.0 lb

## 2020-11-09 DIAGNOSIS — Z6838 Body mass index (BMI) 38.0-38.9, adult: Secondary | ICD-10-CM

## 2020-11-09 DIAGNOSIS — N952 Postmenopausal atrophic vaginitis: Secondary | ICD-10-CM | POA: Diagnosis not present

## 2020-11-09 DIAGNOSIS — R42 Dizziness and giddiness: Secondary | ICD-10-CM

## 2020-11-09 DIAGNOSIS — Z8616 Personal history of COVID-19: Secondary | ICD-10-CM

## 2020-11-09 DIAGNOSIS — R0982 Postnasal drip: Secondary | ICD-10-CM | POA: Diagnosis not present

## 2020-11-09 DIAGNOSIS — Z23 Encounter for immunization: Secondary | ICD-10-CM

## 2020-11-09 DIAGNOSIS — Z0000 Encounter for general adult medical examination without abnormal findings: Secondary | ICD-10-CM

## 2020-11-09 MED ORDER — ZOSTER VAC RECOMB ADJUVANTED 50 MCG/0.5ML IM SUSR: 0.5000 mL | Freq: Once | INTRAMUSCULAR | 0 refills | Status: AC

## 2020-11-09 MED ORDER — LEVOCETIRIZINE DIHYDROCHLORIDE 5 MG PO TABS: 5.0000 mg | ORAL_TABLET | Freq: Every evening | ORAL | 1 refills | Status: DC

## 2020-11-09 NOTE — Patient Instructions (Addendum)
Dr. [facility] (chiropractor)  Health Maintenance, Female Adopting a healthy lifestyle and getting preventive care are important in promoting health and wellness. Ask your health care provider about:  The right schedule for you to have regular tests and exams.  Things you can do on your own to prevent diseases and keep yourself healthy. What should I know about diet, weight, and exercise? Eat a healthy diet  Eat a diet that includes plenty of vegetables, fruits, low-fat dairy products, and lean protein.  Do not eat a lot of foods that are high in solid fats, added sugars, or sodium.   Maintain a healthy weight Body mass index (BMI) is used to identify weight problems. It estimates body fat based on height and weight. Your health care provider can help determine your BMI and help you achieve or maintain a healthy weight. Get regular exercise Get regular exercise. This is one of the most important things you can do for your health. Most adults should:  Exercise for at least 150 minutes each week. The exercise should increase your heart rate and make you sweat (moderate-intensity exercise).  Do strengthening exercises at least twice a week. This is in addition to the moderate-intensity exercise.  Spend less time sitting. Even light physical activity can be beneficial. Watch cholesterol and blood lipids Have your blood tested for lipids and cholesterol at 56 years of age, then have this test every 5 years. Have your cholesterol levels checked more often if:  Your lipid or cholesterol levels are high.  You are older than 56 years of age.  You are at high risk for heart disease. What should I know about cancer screening? Depending on your health history and family history, you may need to have cancer screening at various ages. This may include screening for:  Breast cancer.  Cervical cancer.  Colorectal cancer.  Skin cancer.  Lung cancer. What should I  know about heart disease, diabetes, and high blood pressure? Blood pressure and heart disease  High blood pressure causes heart disease and increases the risk of stroke. This is more likely to develop in people who have high blood pressure readings, are of African descent, or are overweight.  Have your blood pressure checked: ? Every 3-5 years if you are 45-47 years of age. ? Every year if you are 56 years old or older. Diabetes Have regular diabetes screenings. This checks your fasting blood sugar level. Have the screening done:  Once every three years after age 56 if you are at a normal weight and have a low risk for diabetes.  More often and at a younger age if you are overweight or have a high risk for diabetes. What should I know about preventing infection? Hepatitis B If you have a higher risk for hepatitis B, you should be screened for this virus. Talk with your health care provider to find out if you are at risk for hepatitis B infection. Hepatitis C Testing is recommended for:  Everyone born from [birth year] through 1966.  Anyone with known risk factors for hepatitis C. Sexually transmitted infections (STIs)  Get screened for STIs, including gonorrhea and chlamydia, if: ? You are sexually active and are younger than 56 years of age. ? You are older than 56 years of age and your health care provider tells you that you are at risk for this type of infection. ? Your sexual activity has changed since you were last screened, and you are at increased risk for chlamydia or  gonorrhea. Ask your health care provider if you are at risk.  Ask your health care provider about whether you are at high risk for HIV. Your health care provider may recommend a prescription medicine to help prevent HIV infection. If you choose to take medicine to prevent HIV, you should first get tested for HIV. You should then be tested every 3 months for as long as you are taking the medicine. Pregnancy  If you are  about to stop having your period (premenopausal) and you may become pregnant, seek counseling before you get pregnant.  Take 400 to 800 micrograms (mcg) of folic acid every day if you become pregnant.  Ask for birth control (contraception) if you want to prevent pregnancy. Osteoporosis and menopause Osteoporosis is a disease in which the bones lose minerals and strength with aging. This can result in bone fractures. If you are 17 years old or older, or if you are at risk for osteoporosis and fractures, ask your health care provider if you should:  Be screened for bone loss.  Take a calcium or vitamin D supplement to lower your risk of fractures.  Be given hormone replacement therapy (HRT) to treat symptoms of menopause. Follow these instructions at home: Lifestyle  Do not use any products that contain nicotine or tobacco, such as cigarettes, e-cigarettes, and chewing tobacco. If you need help quitting, ask your health care provider.  Do not use street drugs.  Do not share needles.  Ask your health care provider for help if you need support or information about quitting drugs. Alcohol use  Do not drink alcohol if: ? Your health care provider tells you not to drink. ? You are pregnant, may be pregnant, or are planning to become pregnant.  If you drink alcohol: ? Limit how much you use to 0-1 drink a day. ? Limit intake if you are breastfeeding.  Be aware of how much alcohol is in your drink. In the [place] one drink equals one 12 oz bottle of beer (355 mL), one 5 oz glass of wine (148 mL), or one 1 oz glass of hard liquor (44 mL). General instructions  Schedule regular health, dental, and eye exams.  Stay current with your vaccines.  Tell your health care provider if: ? You often feel depressed. ? You have ever been abused or do not feel safe at home. Summary  Adopting a healthy lifestyle and getting preventive care are important in promoting health and wellness.  Follow  your health care provider's instructions about healthy diet, exercising, and getting tested or screened for diseases.  Follow your health care provider's instructions on monitoring your cholesterol and blood pressure. This information is not intended to replace advice given to you by your health care provider. Make sure you discuss any questions you have with your health care provider. Document Revised: 05/15/2018 Document Reviewed: 05/15/2018 Elsevier Patient Education  2021 [employer].

## 2020-11-09 NOTE — Progress Notes (Signed)
I,Yamilka [employer] as a Education administrator for Maximino Greenland, MD.,have documented all relevant documentation on the behalf of Maximino Greenland, MD,as directed by  Maximino Greenland, MD while in the presence of Maximino Greenland, MD. This visit occurred during the SARS-CoV-2 public health emergency.  Safety protocols were in place, including screening questions prior to the visit, additional usage of staff PPE, and extensive cleaning of exam room while observing appropriate contact time as indicated for disinfecting solutions.  Subjective:     Patient ID: Kimberly Berry , female    DOB: 07/25/1965 , 55 y.o.   MRN: [id number]   Chief Complaint  Patient presents with  . Annual Exam    HPI  Patient here for physical exam. She is followed by Dr. Ronita Hipps for her GYN care. She was evaluated in Fall 2021 for postmenopausal bleeding. She had D&C and hysteroscopy performed in Sept 8811 without complications. She has not had any problems since the procedure.     Past Medical History:  Diagnosis Date  . Anemia yrs ago   hx IDA  . Chronic back pain   . Idiopathic scoliosis   . PMB (postmenopausal bleeding) 07/2019   x 1  . Sickle cell trait (HCC)      Family History  Problem Relation Age of Onset  . Dementia Father   . Colon polyps Neg Hx   . Colon cancer Neg Hx   . Esophageal cancer Neg Hx   . Stomach cancer Neg Hx   . Rectal cancer Neg Hx      Current Outpatient Medications:  .  levocetirizine (XYZAL) 5 MG tablet, Take 1 tablet (5 mg total) by mouth every evening., Disp: 30 tablet, Rfl: 1 .  Zoster Vaccine Adjuvanted [facility]) injection, Inject 0.5 mLs into the muscle once for 1 dose., Disp: 0.5 mL, Rfl: 0   Allergies  Allergen Reactions  . Eggs Or Egg-Derived Products     Vomiting, cramps, red hives  . Shrimp [Shellfish Allergy]     Difficulty breathing      The patient states she uses post menopausal status for birth control. Last LMP was No LMP recorded (lmp unknown).  Patient is postmenopausal.. Negative for Dysmenorrhea. Negative for: breast discharge, breast lump(s), breast pain and breast self exam. Associated symptoms include abnormal vaginal bleeding. Pertinent negatives include abnormal bleeding (hematology), anxiety, decreased libido, depression, difficulty falling sleep, dyspareunia, history of infertility, nocturia, sexual dysfunction, sleep disturbances, urinary incontinence, urinary urgency, vaginal discharge and vaginal itching. Diet regular.The patient states her exercise level is  intermittent.   . The patient's tobacco use is:  Social History   Tobacco Use  Smoking Status Never Smoker  Smokeless Tobacco Never Used  . She has been exposed to passive smoke. The patient's alcohol use is:  Social History   Substance and Sexual Activity  Alcohol Use Not Currently  . Additional information: Last pap 12/24/19, next one due in 3 years.  Review of Systems  Constitutional: Negative.   HENT: Positive for postnasal drip.        She has dry cough,c/o persistent drainage. No fever/chills. Had COVID back in May. Has not tried any OTC meds.   Eyes: Negative.   Respiratory: Negative.   Cardiovascular: Negative.   Gastrointestinal: Negative.   Endocrine: Negative.   Genitourinary: Negative.        She c/o vaginal dryness. She had improvement with vitamin E suppositories in the past. Would like to get a refill.   Musculoskeletal:  Negative.   Skin: Negative.   Allergic/Immunologic: Negative.   Neurological: Positive for dizziness.       She had episode of dizziness in May 2022, which resulted in ER visit. Unfortunately, she had unpleasant experience during this visit. She was seen in f/u by NP, who referred her to ENT. She would like referral elsewhere b/c she was advised her visit would be in Dec.   She is better since ER d/c; however, she is not yet quite herself. She would like further evaluation.   Hematological: Negative.   Psychiatric/Behavioral:  Negative.      Today's Vitals   11/09/20 0846  BP: 116/74  Pulse: 93  Temp: 98.3 F (36.8 C)  Weight: 222 lb (100.7 kg)  Height: 5' 3.4" (1.61 m)  PainSc: 0-No pain   Body mass index is 38.83 kg/m.   Wt Readings from Last 3 Encounters:  11/09/20 222 lb (100.7 kg)  10/15/20 223 lb 12.3 oz (101.5 kg)  02/18/20 223 lb 11 oz (101.5 kg)    Objective:  Physical Exam Vitals and nursing note reviewed.  Constitutional:      Appearance: Normal appearance. She is obese.  HENT:     Head: Normocephalic and atraumatic.     Right Ear: Tympanic membrane, ear canal and external ear normal.     Left Ear: Tympanic membrane, ear canal and external ear normal.     Nose:     Comments: Masked     Mouth/Throat:     Comments: Masked  Eyes:     Extraocular Movements: Extraocular movements intact.     Conjunctiva/sclera: Conjunctivae normal.     Pupils: Pupils are equal, round, and reactive to light.  Cardiovascular:     Rate and Rhythm: Normal rate and regular rhythm.     Pulses: Normal pulses.     Heart sounds: Normal heart sounds.  Pulmonary:     Effort: Pulmonary effort is normal.     Breath sounds: Normal breath sounds.  Chest:  Breasts:     Tanner Score is 5.     Right: Normal.     Left: Normal.    Abdominal:     General: Bowel sounds are normal.     Palpations: Abdomen is soft.     Comments: Obese, soft  Genitourinary:    Comments: deferred Musculoskeletal:        General: Normal range of motion.     Cervical back: Normal range of motion and neck supple.  Skin:    General: Skin is warm and dry.  Neurological:     General: No focal deficit present.     Mental Status: She is alert and oriented to person, place, and time.  Psychiatric:        Mood and Affect: Mood normal.        Behavior: Behavior normal.         Assessment And Plan:     1. Encounter for general adult medical examination w/o abnormal findings Comments: A full exam was performed. Importance of  monthly self breast exams was discussed with the patient. PATIENT IS ADVISED TO GET 30-45 MINUTES REGULAR EXERCISE NO LESS THAN FOUR TO FIVE DAYS PER WEEK - BOTH WEIGHTBEARING EXERCISES AND AEROBIC ARE RECOMMENDED.  PATIENT IS ADVISED TO FOLLOW A HEALTHY DIET WITH AT LEAST SIX FRUITS/VEGGIES PER DAY, DECREASE INTAKE OF RED MEAT, AND TO INCREASE FISH INTAKE TO TWO DAYS PER WEEK.  MEATS/FISH SHOULD NOT BE FRIED, BAKED OR BROILED IS PREFERABLE.  IT IS ALSO IMPORTANT TO CUT BACK ON YOUR SUGAR INTAKE. PLEASE AVOID ANYTHING WITH ADDED SUGAR, CORN SYRUP OR OTHER SWEETENERS. IF YOU MUST USE A SWEETENER, YOU CAN TRY STEVIA. IT IS ALSO IMPORTANT TO AVOID ARTIFICIALLY SWEETENERS AND DIET BEVERAGES. LASTLY, I SUGGEST WEARING SPF 50 SUNSCREEN ON EXPOSED PARTS AND ESPECIALLY WHEN IN THE DIRECT SUNLIGHT FOR AN EXTENDED PERIOD OF TIME.  PLEASE AVOID FAST FOOD RESTAURANTS AND INCREASE YOUR WATER INTAKE.  - CMP14+EGFR - CBC - Hepatitis C antibody - Lipid panel - Insulin, random(561)  2. Postnasal drip Comments: I will send rx levocetirizine to her local pharmacy, advised to take in the evenings since drowsiness may occur. Encouraged to mention at ENT appt.  - Ambulatory referral to ENT  3. Vertigo Comments: Resolving. I will refer her to ENT for further evaluation.  - Ambulatory referral to ENT  4. Atrophic vaginitis Comments: I will call in refill of vaginal vitamin E suppositories to use nightly prn. This will be called into [facility] [facility], compounding pharmacy.   5. Class 2 severe obesity due to excess calories with serious comorbidity and body mass index (BMI) of 38.0 to 38.9 in adult [facility]) Comments: She is encouraged to strive for BMI less than 30 to decrease cardiac risk. ADvised to aim for at least 150 minutes of exercise per week. She will continue to use [employer].   6. Immunization due Comments: I will send rx Shingrix to her local pharmacy.  - Zoster Vaccine Adjuvanted [facility]) injection;  Inject 0.5 mLs into the muscle once for 1 dose.  Dispense: 0.5 mL; Refill: 0  7. Personal history of COVID-19 Comments: She is COVID vaccinated and boosted x 1. She is encouraged to get 2nd booster within sixty days.   Patient was given opportunity to ask questions. Patient verbalized understanding of the plan and was able to repeat key elements of the plan. All questions were answered to their satisfaction.   I, Maximino Greenland, MD, have reviewed all documentation for this visit. The documentation on 11/09/20 for the exam, diagnosis, procedures, and orders are all accurate and complete.  THE PATIENT IS ENCOURAGED TO PRACTICE SOCIAL DISTANCING DUE TO THE COVID-19 PANDEMIC.

## 2020-11-10 LAB — CMP14+EGFR
ALT: 16 IU/L (ref 0–32)
AST: 16 IU/L (ref 0–40)
Albumin/Globulin Ratio: 1.5 (ref 1.2–2.2)
Albumin: 4.7 g/dL (ref 3.8–4.9)
Alkaline Phosphatase: 99 IU/L (ref 44–121)
BUN/Creatinine Ratio: 16 (ref 9–23)
BUN: 13 mg/dL (ref 6–24)
Bilirubin Total: 0.3 mg/dL (ref 0.0–1.2)
CO2: 23 mmol/L (ref 20–29)
Calcium: 10 mg/dL (ref 8.7–10.2)
Chloride: 104 mmol/L (ref 96–106)
Creatinine, Ser: 0.82 mg/dL (ref 0.57–1.00)
Globulin, Total: 3.1 g/dL (ref 1.5–4.5)
Glucose: 82 mg/dL (ref 65–99)
Potassium: 4.4 mmol/L (ref 3.5–5.2)
Sodium: 141 mmol/L (ref 134–144)
Total Protein: 7.8 g/dL (ref 6.0–8.5)
eGFR: 84 mL/min/{1.73_m2} (ref >59)

## 2020-11-10 LAB — LIPID PANEL
Chol/HDL Ratio: 3.6 ratio (ref 0.0–4.4)
Cholesterol, Total: 213 mg/dL — ABNORMAL HIGH (ref 100–199)
HDL: 59 mg/dL (ref >39)
LDL Chol Calc (NIH): 145 mg/dL — ABNORMAL HIGH (ref 0–99)
Triglycerides: 53 mg/dL (ref 0–149)
VLDL Cholesterol Cal: 9 mg/dL (ref 5–40)

## 2020-11-10 LAB — CBC
Hematocrit: 37.9 % (ref 34.0–46.6)
Hemoglobin: 12.2 g/dL (ref 11.1–15.9)
MCH: 27.1 pg (ref 26.6–33.0)
MCHC: 32.2 g/dL (ref 31.5–35.7)
MCV: 84 fL (ref 79–97)
Platelets: 251 10*3/uL (ref 150–450)
RBC: 4.5 x10E6/uL (ref 3.77–5.28)
RDW: 13 % (ref 11.7–15.4)
WBC: 5.8 10*3/uL (ref 3.4–10.8)

## 2020-11-10 LAB — HEPATITIS C ANTIBODY: Hep C Virus Ab: <0.1 s/co ratio (ref 0.0–0.9)

## 2020-11-10 LAB — INSULIN, RANDOM: INSULIN: 10.4 u[IU]/mL (ref 2.6–24.9)

## 2020-11-11 ENCOUNTER — Encounter: Payer: Self-pay | Admitting: Internal Medicine

## 2021-03-23 ENCOUNTER — Other Ambulatory Visit (HOSPITAL_BASED_OUTPATIENT_CLINIC_OR_DEPARTMENT_OTHER): Payer: Self-pay

## 2021-03-23 ENCOUNTER — Other Ambulatory Visit: Payer: Self-pay | Admitting: Otolaryngology

## 2021-03-23 DIAGNOSIS — J392 Other diseases of pharynx: Secondary | ICD-10-CM

## 2021-03-23 MED ORDER — INFLUENZA VAC SPLIT QUAD 0.5 ML IM SUSY
PREFILLED_SYRINGE | INTRAMUSCULAR | 0 refills | Status: DC
Start: 2021-03-23 — End: 2022-02-21
  Filled 2021-03-23: qty 0.5, 1d supply, fill #0

## 2021-03-24 ENCOUNTER — Other Ambulatory Visit (HOSPITAL_BASED_OUTPATIENT_CLINIC_OR_DEPARTMENT_OTHER): Payer: Self-pay

## 2021-03-24 ENCOUNTER — Ambulatory Visit: Payer: 59 | Attending: Internal Medicine

## 2021-03-24 DIAGNOSIS — Z23 Encounter for immunization: Secondary | ICD-10-CM

## 2021-03-24 MED ORDER — PFIZER COVID-19 VAC BIVALENT 30 MCG/0.3ML IM SUSP
INTRAMUSCULAR | 0 refills | Status: DC
  Filled 2021-03-24: qty 0.3, 1d supply, fill #0

## 2021-03-24 NOTE — Progress Notes (Signed)
   Covid-19 Vaccination Clinic  Name:  Kimberly Berry    MRN: [id number] DOB: 20-Dec-1965  03/24/2021  Ms. Castner was observed post Covid-19 immunization for 15 minutes without incident. She was provided with Vaccine Information Sheet and instruction to access the V-Safe system.   Ms. Sinnett was instructed to call 911 with any severe reactions post vaccine: Difficulty breathing  Swelling of face and throat  A fast heartbeat  A bad rash all over body  Dizziness and weakness   Immunizations Administered     Name Date Dose VIS Date Route   Pfizer Covid-19 Vaccine Bivalent Booster 03/24/2021  9:48 AM 0.3 mL 02/02/2021 Intramuscular   Manufacturer: [place]   Lot: [id number]   [place]: [phone]

## 2021-04-20 ENCOUNTER — Other Ambulatory Visit: Payer: Self-pay

## 2021-04-20 ENCOUNTER — Ambulatory Visit
Admission: RE | Admit: 2021-04-20 | Discharge: 2021-04-20 | Disposition: A | Discharge status: Home / Self Care | Payer: 59 | Source: Ambulatory Visit | Attending: Otolaryngology | Admitting: Otolaryngology

## 2021-04-20 DIAGNOSIS — J392 Other diseases of pharynx: Secondary | ICD-10-CM

## 2021-04-20 MED ORDER — IOPAMIDOL (ISOVUE-300) INJECTION 61%
75.0000 mL | Freq: Once | INTRAVENOUS | Status: AC | PRN
  Administered 2021-04-20: 75 mL via INTRAVENOUS

## 2021-11-10 ENCOUNTER — Encounter: Payer: 59 | Admitting: Internal Medicine

## 2021-12-21 IMAGING — MR MR HEAD W/O CM
12 of 13 series · 44 of 48 positions shown · non-contrast
Comparison: Prior CT from earlier the same day.

CLINICAL DATA: Initial evaluation for acute vertigo.

EXAM:
MRI HEAD WITHOUT CONTRAST
TECHNIQUE: Multiplanar, multiecho pulse sequences of the brain and surrounding
structures were obtained without intravenous contrast.

[Series 5: DWI · axial · 3.0mm · 0.88mm/px · z∈[-129,+10]mm · 8 of 96 slices shown (1 of 4)]
[im 1/96]
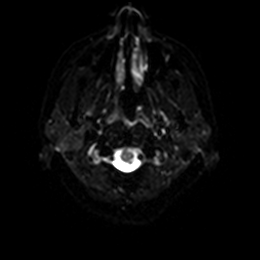
[im 14/96]
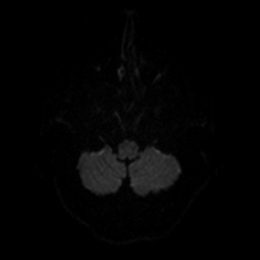
[im 28/96]
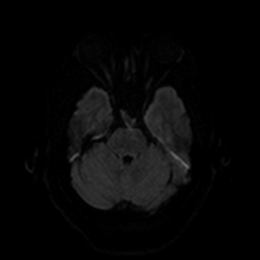
[im 41/96]
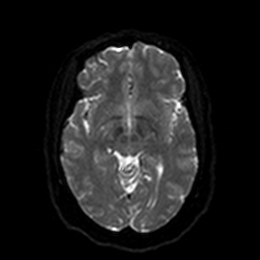
[im 55/96]
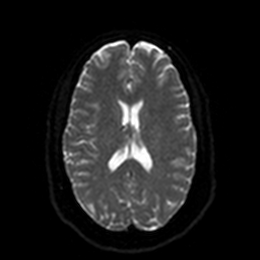
[im 68/96]
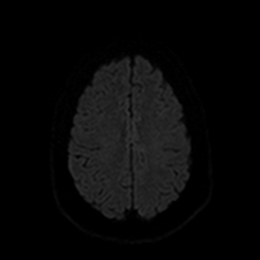
[im 82/96]
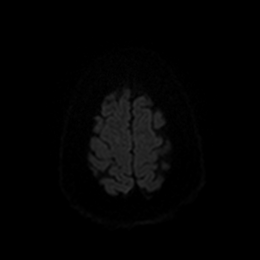
[im 96/96]
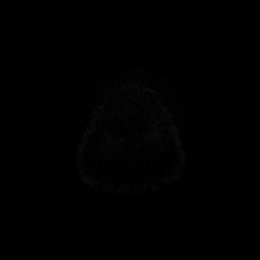

[Series 6: DWI · axial · 3.0mm · 0.88mm/px · z∈[-129,+10]mm · 4 of 48 slices shown (2 of 4)]
[im 1/48]
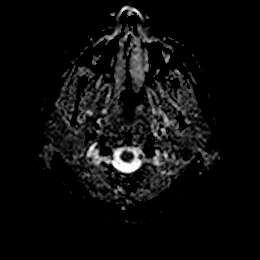
[im 16/48]
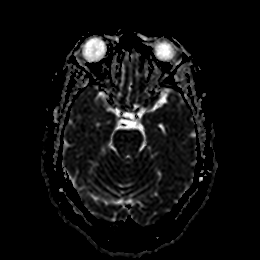
[im 32/48]
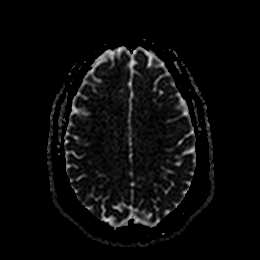
[im 48/48]
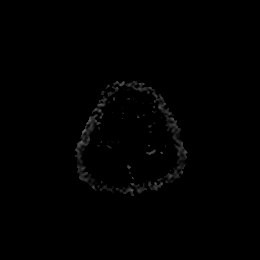

[Series 7: DWI · coronal · 4.0mm · 0.88mm/px · 5 of 70 slices shown (3 of 4)]
[im 1/70]
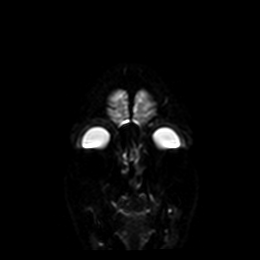
[im 18/70]
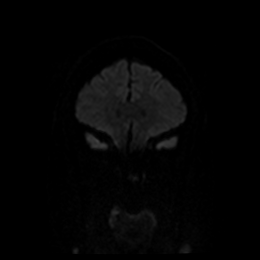
[im 35/70]
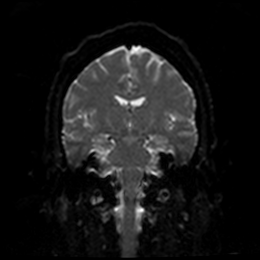
[im 52/70]
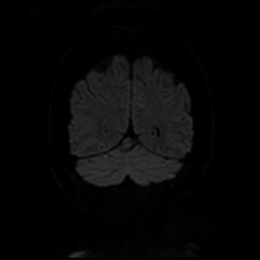
[im 70/70]
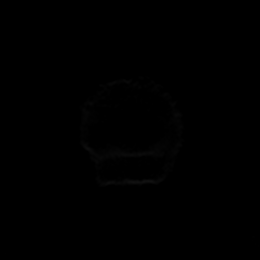

[Series 8: DWI · coronal · 4.0mm · 0.88mm/px · 3 of 35 slices shown (4 of 4)]
[im 1/35]
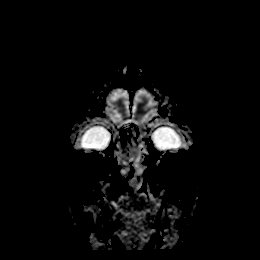
[im 18/35]
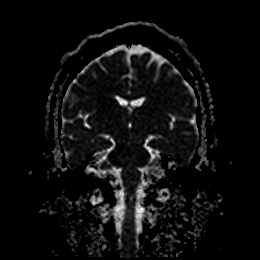
[im 35/35]
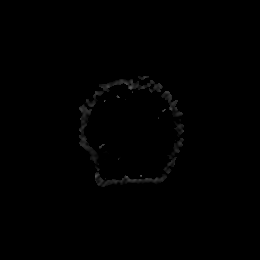

[Series 9: T1 · sagittal · 5.0mm · 0.75mm/px · 2 of 23 slices shown]
[im 1/23]
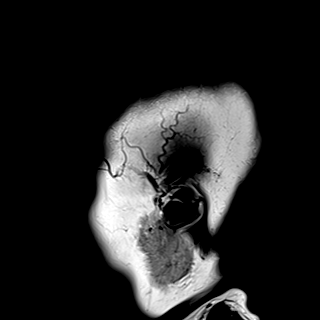
[im 23/23]
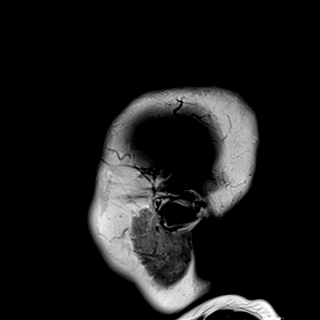

[Series 10: T2 · axial · 5.0mm · 0.72mm/px · z∈[-122,+19]mm · 2 of 25 slices shown (1 of 2)]
[im 1/25]
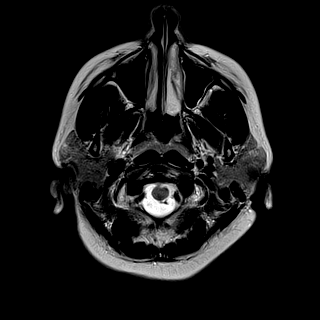
[im 25/25]
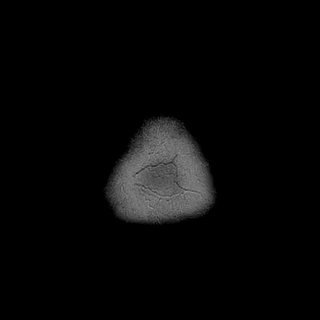

[Series 11: FLAIR · axial · 5.0mm · 0.45mm/px · z∈[-122,+19]mm · 2 of 25 slices shown]
[im 1/25]
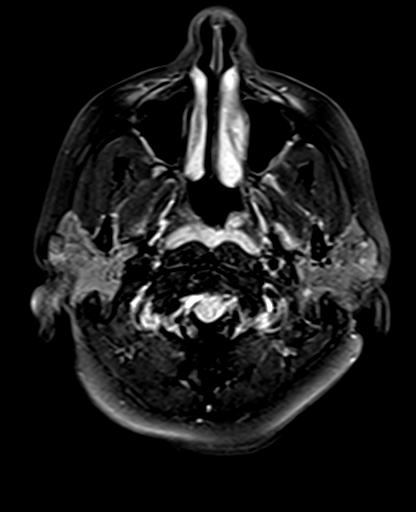
[im 25/25]
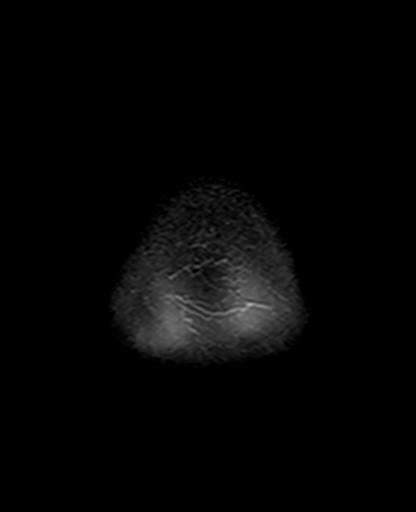

[Series 12: mag_images · axial · 3.0mm · 0.90mm/px · z∈[-133,+30]mm · 4 of 56 slices shown]
[im 1/56]
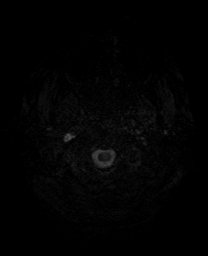
[im 19/56]
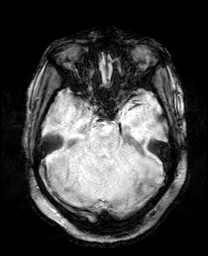
[im 37/56]
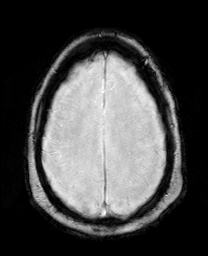
[im 56/56]
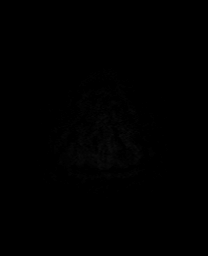

[Series 13: pha_images · axial · 3.0mm · 0.90mm/px · z∈[-133,+30]mm · 4 of 56 slices shown]
[im 1/56]
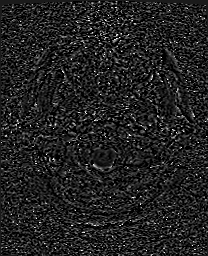
[im 19/56]
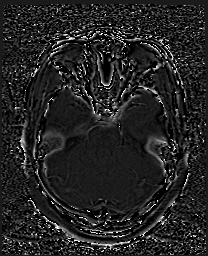
[im 37/56]
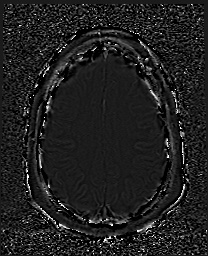
[im 56/56]
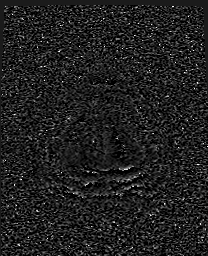

[Series 14: swi_images · axial · 3.0mm · 0.90mm/px · z∈[-133,+30]mm · 4 of 56 slices shown]
[im 1/56]
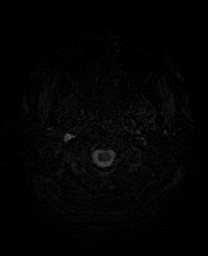
[im 19/56]
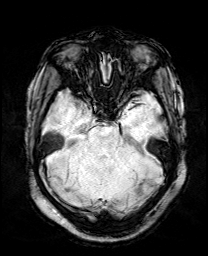
[im 37/56]
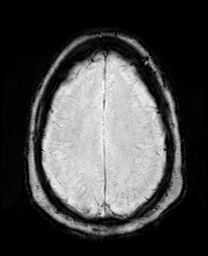
[im 56/56]
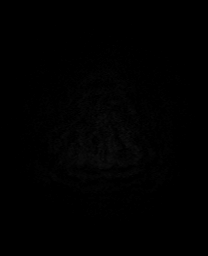

[Series 15: mip_images(sw) · axial · 24.0mm · 0.90mm/px · z∈[-122,+19]mm · 4 of 49 slices shown]
[im 1/49]
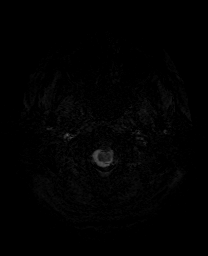
[im 17/49]
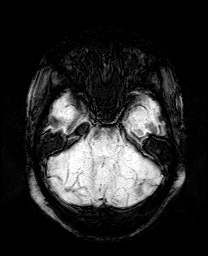
[im 33/49]
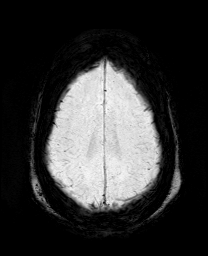
[im 49/49]
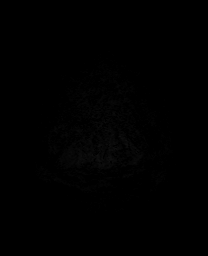

[Series 17: T2 · coronal · 5.0mm · 0.34mm/px · 2 of 29 slices shown (2 of 2)]
[im 1/29]
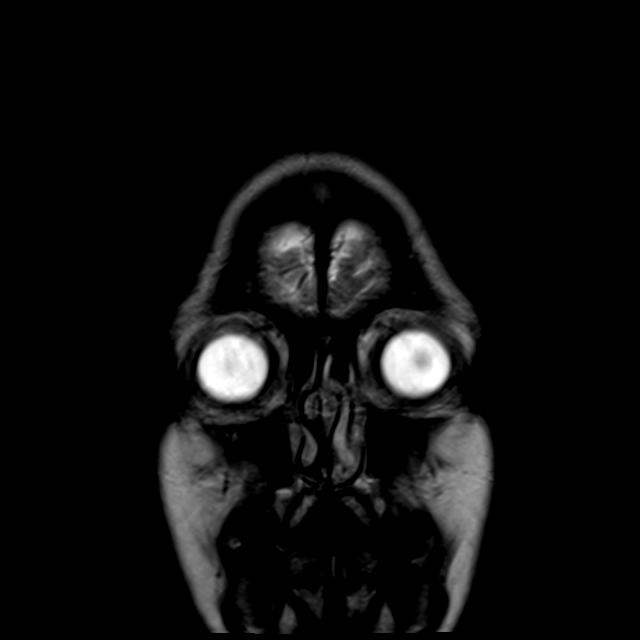
[im 29/29]
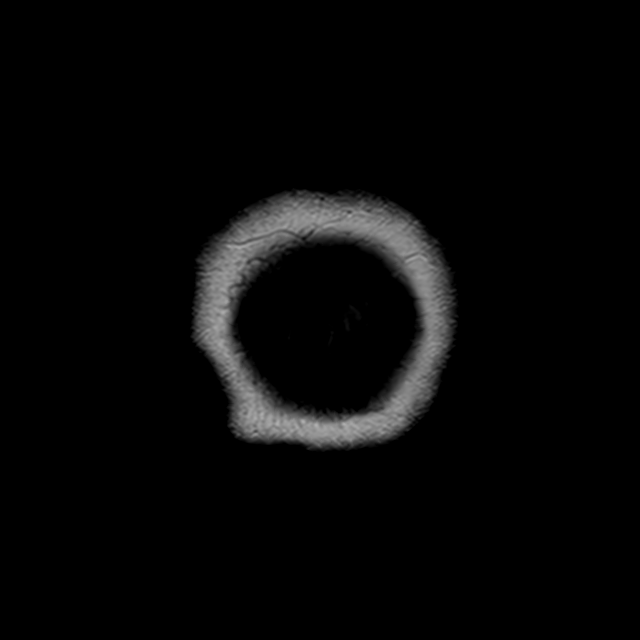

[44 of 48 positions shown; findings below may reference images not displayed]

FINDINGS: Brain: Cerebral volume within normal limits. Few punctate foci of
subcentimeter T2/FLAIR hyperintensity noted involving the
supratentorial cerebral white matter, nonspecific, but overall
extremely mild in nature, and felt to be within normal limits for
age.

No abnormal foci of restricted diffusion to suggest acute or
subacute ischemia. Gray-white matter differentiation maintained. No
encephalomalacia to suggest chronic cortical infarction. No evidence
for acute or chronic intracranial hemorrhage.

No mass lesion, midline shift or mass effect. No hydrocephalus or
extra-axial fluid collection. Pituitary gland suprasellar region
normal. Midline structures intact.

Vascular: Major intracranial vascular flow voids are maintained.

Skull and upper cervical spine: Craniocervical junction normal. Bone
marrow signal intensity within normal limits. No scalp soft tissue
abnormality.

Sinuses/Orbits: Globes and orbital soft tissues within normal
limits. Mild scattered mucosal thickening noted within the ethmoidal
air cells and maxillary sinuses. Paranasal sinuses are otherwise
clear. No mastoid effusion. Inner ear structures grossly normal.

Other: None.
IMPRESSION: Normal brain MRI for age. No acute intracranial abnormality
identified.

## 2022-01-24 DIAGNOSIS — Z01419 Encounter for gynecological examination (general) (routine) without abnormal findings: Secondary | ICD-10-CM | POA: Diagnosis not present

## 2022-01-24 DIAGNOSIS — Z124 Encounter for screening for malignant neoplasm of cervix: Secondary | ICD-10-CM | POA: Diagnosis not present

## 2022-01-24 DIAGNOSIS — Z0142 Encounter for cervical smear to confirm findings of recent normal smear following initial abnormal smear: Secondary | ICD-10-CM | POA: Diagnosis not present

## 2022-01-24 DIAGNOSIS — R69 Illness, unspecified: Secondary | ICD-10-CM | POA: Diagnosis not present

## 2022-01-24 DIAGNOSIS — Z1231 Encounter for screening mammogram for malignant neoplasm of breast: Secondary | ICD-10-CM | POA: Diagnosis not present

## 2022-01-24 DIAGNOSIS — N952 Postmenopausal atrophic vaginitis: Secondary | ICD-10-CM | POA: Diagnosis not present

## 2022-01-24 DIAGNOSIS — Z01411 Encounter for gynecological examination (general) (routine) with abnormal findings: Secondary | ICD-10-CM | POA: Diagnosis not present

## 2022-01-24 DIAGNOSIS — Z6837 Body mass index (BMI) 37.0-37.9, adult: Secondary | ICD-10-CM | POA: Diagnosis not present

## 2022-01-24 LAB — HM MAMMOGRAPHY

## 2022-01-24 LAB — HM PAP SMEAR

## 2022-02-14 LAB — HM DEXA SCAN

## 2022-02-17 DIAGNOSIS — N3946 Mixed incontinence: Secondary | ICD-10-CM | POA: Diagnosis not present

## 2022-02-17 DIAGNOSIS — M6281 Muscle weakness (generalized): Secondary | ICD-10-CM | POA: Diagnosis not present

## 2022-02-17 DIAGNOSIS — R278 Other lack of coordination: Secondary | ICD-10-CM | POA: Diagnosis not present

## 2022-02-21 ENCOUNTER — Encounter: Payer: Self-pay | Admitting: Internal Medicine

## 2022-02-21 ENCOUNTER — Ambulatory Visit (INDEPENDENT_AMBULATORY_CARE_PROVIDER_SITE_OTHER): Payer: 59 | Admitting: Internal Medicine

## 2022-02-21 VITALS — BP 110/70 | HR 85 | Temp 98.1°F | Ht 63.4 in | Wt 212.2 lb

## 2022-02-21 DIAGNOSIS — Z0000 Encounter for general adult medical examination without abnormal findings: Secondary | ICD-10-CM

## 2022-02-21 DIAGNOSIS — Z6837 Body mass index (BMI) 37.0-37.9, adult: Secondary | ICD-10-CM

## 2022-02-21 DIAGNOSIS — G8929 Other chronic pain: Secondary | ICD-10-CM

## 2022-02-21 DIAGNOSIS — Z2821 Immunization not carried out because of patient refusal: Secondary | ICD-10-CM | POA: Diagnosis not present

## 2022-02-21 DIAGNOSIS — E559 Vitamin D deficiency, unspecified: Secondary | ICD-10-CM

## 2022-02-21 DIAGNOSIS — M25571 Pain in right ankle and joints of right foot: Secondary | ICD-10-CM

## 2022-02-21 DIAGNOSIS — E6609 Other obesity due to excess calories: Secondary | ICD-10-CM | POA: Diagnosis not present

## 2022-02-21 DIAGNOSIS — E66812 Obesity, class 2: Secondary | ICD-10-CM

## 2022-02-21 DIAGNOSIS — N952 Postmenopausal atrophic vaginitis: Secondary | ICD-10-CM

## 2022-02-21 NOTE — Patient Instructions (Addendum)
Multivitamin with minerals  Try Voltaren gel - apply topically to right ankle/knee twice daily as needed  [place]eltic sea salt  The 10-year ASCVD risk score (Arnett DK, et al., 2019) is: 2.2%   Values used to calculate the score:     Age: 56 years     Sex: Female     Is Non-Hispanic African American: Yes     Diabetic: No     Tobacco smoker: No     Systolic Blood Pressure: 683 mmHg     Is BP treated: No     HDL Cholesterol: 59 mg/dL     Total Cholesterol: 213 mg/dL   Health Maintenance, Female Adopting a healthy lifestyle and getting preventive care are important in promoting health and wellness. Ask your health care provider about: The right schedule for you to have regular tests and exams. Things you can do on your own to prevent diseases and keep yourself healthy. What should I know about diet, weight, and exercise? Eat a healthy diet  Eat a diet that includes plenty of vegetables, fruits, low-fat dairy products, and lean protein. Do not eat a lot of foods that are high in solid fats, added sugars, or sodium. Maintain a healthy weight Body mass index (BMI) is used to identify weight problems. It estimates body fat based on height and weight. Your health care provider can help determine your BMI and help you achieve or maintain a healthy weight. Get regular exercise Get regular exercise. This is one of the most important things you can do for your health. Most adults should: Exercise for at least 150 minutes each week. The exercise should increase your heart rate and make you sweat (moderate-intensity exercise). Do strengthening exercises at least twice a week. This is in addition to the moderate-intensity exercise. Spend less time sitting. Even light physical activity can be beneficial. Watch cholesterol and blood lipids Have your blood tested for lipids and cholesterol at 57 years of age, then have this test every 5 years. Have your cholesterol levels checked more often if: Your  lipid or cholesterol levels are high. You are older than 57 years of age. You are at high risk for heart disease. What should I know about cancer screening? Depending on your health history and family history, you may need to have cancer screening at various ages. This may include screening for: Breast cancer. Cervical cancer. Colorectal cancer. Skin cancer. Lung cancer. What should I know about heart disease, diabetes, and high blood pressure? Blood pressure and heart disease High blood pressure causes heart disease and increases the risk of stroke. This is more likely to develop in people who have high blood pressure readings or are overweight. Have your blood pressure checked: Every 3-5 years if you are 58-72 years of age. Every year if you are 31 years old or older. Diabetes Have regular diabetes screenings. This checks your fasting blood sugar level. Have the screening done: Once every three years after age 11 if you are at a normal weight and have a low risk for diabetes. More often and at a younger age if you are overweight or have a high risk for diabetes. What should I know about preventing infection? Hepatitis B If you have a higher risk for hepatitis B, you should be screened for this virus. Talk with your health care provider to find out if you are at risk for hepatitis B infection. Hepatitis C Testing is recommended for: Everyone born from [birth year] through 1966. Anyone with known  risk factors for hepatitis C. Sexually transmitted infections (STIs) Get screened for STIs, including gonorrhea and chlamydia, if: You are sexually active and are younger than 57 years of age. You are older than 57 years of age and your health care provider tells you that you are at risk for this type of infection. Your sexual activity has changed since you were last screened, and you are at increased risk for chlamydia or gonorrhea. Ask your health care provider if you are at risk. Ask your health  care provider about whether you are at high risk for HIV. Your health care provider may recommend a prescription medicine to help prevent HIV infection. If you choose to take medicine to prevent HIV, you should first get tested for HIV. You should then be tested every 3 months for as long as you are taking the medicine. Pregnancy If you are about to stop having your period (premenopausal) and you may become pregnant, seek counseling before you get pregnant. Take 400 to 800 micrograms (mcg) of folic acid every day if you become pregnant. Ask for birth control (contraception) if you want to prevent pregnancy. Osteoporosis and menopause Osteoporosis is a disease in which the bones lose minerals and strength with aging. This can result in bone fractures. If you are 75 years old or older, or if you are at risk for osteoporosis and fractures, ask your health care provider if you should: Be screened for bone loss. Take a calcium or vitamin D supplement to lower your risk of fractures. Be given hormone replacement therapy (HRT) to treat symptoms of menopause. Follow these instructions at home: Alcohol use Do not drink alcohol if: Your health care provider tells you not to drink. You are pregnant, may be pregnant, or are planning to become pregnant. If you drink alcohol: Limit how much you have to: 0-1 drink a day. Know how much alcohol is in your drink. In the [place] one drink equals one 12 oz bottle of beer (355 mL), one 5 oz glass of wine (148 mL), or one 1 oz glass of hard liquor (44 mL). Lifestyle Do not use any products that contain nicotine or tobacco. These products include cigarettes, chewing tobacco, and vaping devices, such as e-cigarettes. If you need help quitting, ask your health care provider. Do not use street drugs. Do not share needles. Ask your health care provider for help if you need support or information about quitting drugs. General instructions Schedule regular health,  dental, and eye exams. Stay current with your vaccines. Tell your health care provider if: You often feel depressed. You have ever been abused or do not feel safe at home. Summary Adopting a healthy lifestyle and getting preventive care are important in promoting health and wellness. Follow your health care provider's instructions about healthy diet, exercising, and getting tested or screened for diseases. Follow your health care provider's instructions on monitoring your cholesterol and blood pressure. This information is not intended to replace advice given to you by your health care provider. Make sure you discuss any questions you have with your health care provider. Document Revised: 10/11/2020 Document Reviewed: 10/11/2020 Elsevier Patient Education  [place].

## 2022-02-21 NOTE — Progress Notes (Signed)
Kimberly Berry,acting as a Education administrator for Kimberly Greenland, MD.,have documented all relevant documentation on the behalf of Kimberly Greenland, MD,as directed by  Kimberly Greenland, MD while in the presence of Kimberly Greenland, MD.   Subjective:     Patient ID: Kimberly Berry , female    DOB: 1966-04-01 , 56 y.o.   MRN: [id number]   Chief Complaint  Patient presents with   Annual Exam    HPI  Patient here for physical exam. She is followed by Dr. Ronita Hipps for her GYN care. She was last seen January 24, 2022.  She states she is now walking six days per week.      Past Medical History:  Diagnosis Date   Anemia yrs ago   hx IDA   Chronic back pain    Idiopathic scoliosis    PMB (postmenopausal bleeding) 07/2019   x 1   Sickle cell trait (HCC)      Family History  Problem Relation Age of Onset   Dementia Father    Colon polyps Neg Hx    Colon cancer Neg Hx    Esophageal cancer Neg Hx    Stomach cancer Neg Hx    Rectal cancer Neg Hx      Current Outpatient Medications:    Cholecalciferol (VITAMIN D3 PO), Take 1 capsule by mouth daily at 12 noon., Disp: , Rfl:    Collagen Hydrolysate, Bovine, POWD, , Disp: , Rfl:    FOLIC ACID-VIT C1-YSA Y30 PO, , Disp: , Rfl:    Multiple Minerals-Vitamins (CALCIUM-MAGNESIUM-ZINC-D3) TABS, , Disp: , Rfl:    Multiple Vitamins-Minerals (MULTIVITAMIN ADULTS 50+ PO), , Disp: , Rfl:    Omega-3 Fatty Acids (FISH OIL) 1000 MG CAPS, , Disp: , Rfl:    Vitamin E (VITAMIN SUPPLEMENT E-1000 PO), Take 1 suppository by mouth daily at 12 noon., Disp: , Rfl:    YUVAFEM 10 MCG TABS vaginal tablet, Place 1 tablet vaginally daily., Disp: , Rfl:    Allergies  Allergen Reactions   Ancef [Cefazolin] Nausea And Vomiting   Eggs Or Egg-Derived Products     Vomiting, cramps, red hives   Shrimp [Shellfish Allergy]     Difficulty breathing      The patient states she uses post menopausal status for birth control. Last LMP was Patient's last menstrual period  was 06/05/2017.. Negative for Dysmenorrhea. Negative for: breast discharge, breast lump(s), breast pain and breast self exam. Associated symptoms include abnormal vaginal bleeding. Pertinent negatives include abnormal bleeding (hematology), anxiety, decreased libido, depression, difficulty falling sleep, dyspareunia, history of infertility, nocturia, sexual dysfunction, sleep disturbances, urinary incontinence, urinary urgency, vaginal discharge and vaginal itching. Diet regular.The patient states her exercise level is  moderate.  . The patient's tobacco use is:  Social History   Tobacco Use  Smoking Status Never  Smokeless Tobacco Never  . She has been exposed to passive smoke. The patient's alcohol use is:  Social History   Substance and Sexual Activity  Alcohol Use Not Currently    Review of Systems  Constitutional: Negative.   HENT: Negative.    Eyes: Negative.   Respiratory:  Negative for apnea.   Cardiovascular: Negative.   Gastrointestinal: Negative.   Endocrine: Negative.   Genitourinary: Negative.   Musculoskeletal: Negative.   Skin: Negative.   Allergic/Immunologic: Negative.   Neurological: Negative.   Hematological: Negative.   Psychiatric/Behavioral: Negative.       Today's Vitals   02/21/22 1416  BP: 110/70  Pulse:  85  Temp: 98.1 F (36.7 C)  Weight: 212 lb 3.2 oz (96.3 kg)  Height: 5' 3.4" (1.61 m)  PainSc: 0-No pain   Body mass index is 37.12 kg/m.  Wt Readings from Last 3 Encounters:  02/21/22 212 lb 3.2 oz (96.3 kg)  11/09/20 222 lb (100.7 kg)  10/15/20 223 lb 12.3 oz (101.5 kg)    Objective:  Physical Exam Vitals and nursing note reviewed.  Constitutional:      Appearance: Normal appearance.  HENT:     Head: Normocephalic and atraumatic.     Right Ear: Tympanic membrane, ear canal and external ear normal.     Left Ear: Tympanic membrane, ear canal and external ear normal.     Nose:     Comments: Masked     Mouth/Throat:     Comments:  Masked  Eyes:     Extraocular Movements: Extraocular movements intact.     Conjunctiva/sclera: Conjunctivae normal.     Pupils: Pupils are equal, round, and reactive to light.  Cardiovascular:     Rate and Rhythm: Normal rate and regular rhythm.     Pulses: Normal pulses.     Heart sounds: Normal heart sounds.  Pulmonary:     Effort: Pulmonary effort is normal.     Breath sounds: Normal breath sounds.  Chest:  Breasts:    Tanner Score is 5.     Right: Normal.     Left: Normal.  Abdominal:     General: Abdomen is flat. Bowel sounds are normal.     Palpations: Abdomen is soft.  Genitourinary:    Comments: deferred Musculoskeletal:        General: Normal range of motion.     Cervical back: Normal range of motion and neck supple.  Skin:    General: Skin is warm and dry.  Neurological:     General: No focal deficit present.     Mental Status: She is alert and oriented to person, place, and time.  Psychiatric:        Mood and Affect: Mood normal.        Behavior: Behavior normal.      Assessment And Plan:     1. Encounter for general adult medical examination w/o abnormal findings Comments: A full exam was performed. Importance of monthly self breast exams was discussed with the patient.   2. Chronic pain of right ankle  3. Vitamin D deficiency disease  4. Class 2 obesity due to excess calories with body mass index (BMI) of 37.0 to 37.9 in adult, unspecified whether serious comorbidity present  5. Influenza vaccination declined  Patient was given opportunity to ask questions. Patient verbalized understanding of the plan and was able to repeat key elements of the plan. All questions were answered to their satisfaction.   I, Kimberly Greenland, MD, have reviewed all documentation for this visit. The documentation on 02/21/22 for the exam, diagnosis, procedures, and orders are all accurate and complete.   THE PATIENT IS ENCOURAGED TO PRACTICE SOCIAL DISTANCING DUE TO THE  COVID-19 PANDEMIC.

## 2022-02-23 LAB — CBC
Hematocrit: 36.6 % (ref 34.0–46.6)
Hemoglobin: 11.8 g/dL (ref 11.1–15.9)
MCH: 27.3 pg (ref 26.6–33.0)
MCHC: 32.2 g/dL (ref 31.5–35.7)
MCV: 85 fL (ref 79–97)
Platelets: 250 10*3/uL (ref 150–450)
RBC: 4.33 x10E6/uL (ref 3.77–5.28)
RDW: 13.2 % (ref 11.7–15.4)
WBC: 6.6 10*3/uL (ref 3.4–10.8)

## 2022-02-23 LAB — CMP14+EGFR
ALT: 18 IU/L (ref 0–32)
AST: 19 IU/L (ref 0–40)
Albumin/Globulin Ratio: 1.7 (ref 1.2–2.2)
Albumin: 4.8 g/dL (ref 3.8–4.9)
Alkaline Phosphatase: 85 IU/L (ref 44–121)
BUN/Creatinine Ratio: 12 (ref 9–23)
BUN: 9 mg/dL (ref 6–24)
Bilirubin Total: 0.5 mg/dL (ref 0.0–1.2)
CO2: 23 mmol/L (ref 20–29)
Calcium: 9.8 mg/dL (ref 8.7–10.2)
Chloride: 100 mmol/L (ref 96–106)
Creatinine, Ser: 0.76 mg/dL (ref 0.57–1.00)
Globulin, Total: 2.9 g/dL (ref 1.5–4.5)
Glucose: 74 mg/dL (ref 70–99)
Potassium: 4.2 mmol/L (ref 3.5–5.2)
Sodium: 139 mmol/L (ref 134–144)
Total Protein: 7.7 g/dL (ref 6.0–8.5)
eGFR: 92 mL/min/{1.73_m2} (ref >59)

## 2022-02-23 LAB — LIPID PANEL
Chol/HDL Ratio: 3.3 ratio (ref 0.0–4.4)
Cholesterol, Total: 196 mg/dL (ref 100–199)
HDL: 59 mg/dL (ref >39)
LDL Chol Calc (NIH): 129 mg/dL — ABNORMAL HIGH (ref 0–99)
Triglycerides: 45 mg/dL (ref 0–149)
VLDL Cholesterol Cal: 8 mg/dL (ref 5–40)

## 2022-02-23 LAB — INSULIN, RANDOM: INSULIN: 2.9 u[IU]/mL (ref 2.6–24.9)

## 2022-03-02 DIAGNOSIS — R278 Other lack of coordination: Secondary | ICD-10-CM | POA: Diagnosis not present

## 2022-03-02 DIAGNOSIS — M6281 Muscle weakness (generalized): Secondary | ICD-10-CM | POA: Diagnosis not present

## 2022-03-02 DIAGNOSIS — N3946 Mixed incontinence: Secondary | ICD-10-CM | POA: Diagnosis not present

## 2022-03-03 ENCOUNTER — Encounter: Payer: Self-pay | Admitting: Internal Medicine

## 2022-03-09 DIAGNOSIS — R278 Other lack of coordination: Secondary | ICD-10-CM | POA: Diagnosis not present

## 2022-03-09 DIAGNOSIS — N3946 Mixed incontinence: Secondary | ICD-10-CM | POA: Diagnosis not present

## 2022-03-09 DIAGNOSIS — M6281 Muscle weakness (generalized): Secondary | ICD-10-CM | POA: Diagnosis not present

## 2022-03-15 DIAGNOSIS — N3946 Mixed incontinence: Secondary | ICD-10-CM | POA: Diagnosis not present

## 2022-03-15 DIAGNOSIS — R278 Other lack of coordination: Secondary | ICD-10-CM | POA: Diagnosis not present

## 2022-03-15 DIAGNOSIS — M6281 Muscle weakness (generalized): Secondary | ICD-10-CM | POA: Diagnosis not present

## 2022-03-30 DIAGNOSIS — M25572 Pain in left ankle and joints of left foot: Secondary | ICD-10-CM | POA: Diagnosis not present

## 2022-03-30 DIAGNOSIS — M25571 Pain in right ankle and joints of right foot: Secondary | ICD-10-CM | POA: Diagnosis not present

## 2022-04-18 DIAGNOSIS — M6281 Muscle weakness (generalized): Secondary | ICD-10-CM | POA: Diagnosis not present

## 2022-04-18 DIAGNOSIS — N3946 Mixed incontinence: Secondary | ICD-10-CM | POA: Diagnosis not present

## 2022-04-18 DIAGNOSIS — R278 Other lack of coordination: Secondary | ICD-10-CM | POA: Diagnosis not present

## 2022-04-19 DIAGNOSIS — M25572 Pain in left ankle and joints of left foot: Secondary | ICD-10-CM | POA: Diagnosis not present

## 2022-04-19 DIAGNOSIS — M25571 Pain in right ankle and joints of right foot: Secondary | ICD-10-CM | POA: Diagnosis not present

## 2022-04-21 DIAGNOSIS — M25572 Pain in left ankle and joints of left foot: Secondary | ICD-10-CM | POA: Diagnosis not present

## 2022-04-21 DIAGNOSIS — M25571 Pain in right ankle and joints of right foot: Secondary | ICD-10-CM | POA: Diagnosis not present

## 2022-05-09 DIAGNOSIS — M25572 Pain in left ankle and joints of left foot: Secondary | ICD-10-CM | POA: Diagnosis not present

## 2022-05-09 DIAGNOSIS — M25571 Pain in right ankle and joints of right foot: Secondary | ICD-10-CM | POA: Diagnosis not present

## 2022-05-16 DIAGNOSIS — M25572 Pain in left ankle and joints of left foot: Secondary | ICD-10-CM | POA: Diagnosis not present

## 2022-05-16 DIAGNOSIS — M25571 Pain in right ankle and joints of right foot: Secondary | ICD-10-CM | POA: Diagnosis not present

## 2022-05-17 DIAGNOSIS — M25571 Pain in right ankle and joints of right foot: Secondary | ICD-10-CM | POA: Diagnosis not present

## 2022-05-17 DIAGNOSIS — M25572 Pain in left ankle and joints of left foot: Secondary | ICD-10-CM | POA: Diagnosis not present

## 2022-05-23 DIAGNOSIS — M25572 Pain in left ankle and joints of left foot: Secondary | ICD-10-CM | POA: Diagnosis not present

## 2022-05-23 DIAGNOSIS — M25571 Pain in right ankle and joints of right foot: Secondary | ICD-10-CM | POA: Diagnosis not present

## 2022-06-07 DIAGNOSIS — M25571 Pain in right ankle and joints of right foot: Secondary | ICD-10-CM | POA: Diagnosis not present

## 2022-06-07 DIAGNOSIS — M25572 Pain in left ankle and joints of left foot: Secondary | ICD-10-CM | POA: Diagnosis not present

## 2022-06-13 DIAGNOSIS — M25571 Pain in right ankle and joints of right foot: Secondary | ICD-10-CM | POA: Diagnosis not present

## 2022-06-13 DIAGNOSIS — M25572 Pain in left ankle and joints of left foot: Secondary | ICD-10-CM | POA: Diagnosis not present

## 2022-07-13 DIAGNOSIS — M25572 Pain in left ankle and joints of left foot: Secondary | ICD-10-CM | POA: Diagnosis not present

## 2022-07-13 DIAGNOSIS — M25571 Pain in right ankle and joints of right foot: Secondary | ICD-10-CM | POA: Diagnosis not present

## 2023-01-17 ENCOUNTER — Encounter: Payer: Self-pay | Admitting: Nurse Practitioner

## 2023-01-17 ENCOUNTER — Ambulatory Visit: Payer: 59 | Admitting: Nurse Practitioner

## 2023-01-17 ENCOUNTER — Ambulatory Visit (HOSPITAL_COMMUNITY)
Admission: RE | Admit: 2023-01-17 | Discharge: 2023-01-17 | Disposition: A | Discharge status: Home / Self Care | Payer: 59 | Source: Ambulatory Visit | Attending: Nurse Practitioner | Admitting: Nurse Practitioner

## 2023-01-17 VITALS — BP 120/70 | HR 85 | Temp 98.7°F | Ht 63.0 in | Wt 213.6 lb

## 2023-01-17 DIAGNOSIS — E669 Obesity, unspecified: Secondary | ICD-10-CM

## 2023-01-17 DIAGNOSIS — R0602 Shortness of breath: Secondary | ICD-10-CM | POA: Diagnosis not present

## 2023-01-17 DIAGNOSIS — M79604 Pain in right leg: Secondary | ICD-10-CM

## 2023-01-17 DIAGNOSIS — E6609 Other obesity due to excess calories: Secondary | ICD-10-CM

## 2023-01-17 DIAGNOSIS — M79605 Pain in left leg: Secondary | ICD-10-CM | POA: Diagnosis not present

## 2023-01-17 LAB — CBC WITH DIFFERENTIAL/PLATELET
Basophils Absolute: 0.1 10*3/uL (ref 0.0–0.2)
Basos: 1 %
EOS (ABSOLUTE): 0.3 10*3/uL (ref 0.0–0.4)
Eos: 4 %
Hematocrit: 39.5 % (ref 34.0–46.6)
Hemoglobin: 12.6 g/dL (ref 11.1–15.9)
Immature Grans (Abs): 0 10*3/uL (ref 0.0–0.1)
Immature Granulocytes: 0 %
Lymphocytes Absolute: 2.1 10*3/uL (ref 0.7–3.1)
Lymphs: 29 %
MCH: 27.7 pg (ref 26.6–33.0)
MCHC: 31.9 g/dL (ref 31.5–35.7)
MCV: 87 fL (ref 79–97)
Monocytes Absolute: 0.4 10*3/uL (ref 0.1–0.9)
Monocytes: 6 %
Neutrophils Absolute: 4.3 10*3/uL (ref 1.4–7.0)
Neutrophils: 60 %
Platelets: 219 10*3/uL (ref 150–450)
RBC: 4.55 x10E6/uL (ref 3.77–5.28)
RDW: 12.7 % (ref 11.7–15.4)
WBC: 7.2 10*3/uL (ref 3.4–10.8)

## 2023-01-17 NOTE — Progress Notes (Signed)
Madelaine Bhat, CMA,acting as a Neurosurgeon for Arnette Felts, FNP.,have documented all relevant documentation on the behalf of Arnette Felts, FNP,as directed by  Arnette Felts, FNP while in the presence of Arnette Felts, FNP.  Subjective:  Patient ID: Kimberly Berry , female    DOB: [birth year] , 57 y.o.   MRN: [id number]  Chief Complaint  Patient presents with   Leg Pain    HPI  Patient presents today for bilateral leg pain, patient reports her legs feel achy, heavy, and tired. Patient reports its been on and off for several weeks but is getting worse. Patient also reports her "veins are popping out". Patient reports compliance with medications. She goes for walks all the time but are now feeling heavy. She has swelling to both of her legs. She had these symptoms before [place] and did a lot of walking approximately 2 hours straight. She did wear flip flops mostly because she thought her feet needed to expand and wore flip flops.   BP Readings from Last 3 Encounters: 01/17/23 : 120/70 02/21/22 : 110/70 11/09/20 : 116/74   Wt Readings from Last 3 Encounters: 01/17/23 : 213 lb 9.6 oz (96.9 kg) 02/21/22 : 212 lb 3.2 oz (96.3 kg) 11/09/20 : 222 lb (100.7 kg)  When she left [place] 2.5 weeks ago she was 202 lbs.       Past Medical History:  Diagnosis Date   Anemia yrs ago   hx IDA   Chronic back pain    Idiopathic scoliosis    PMB (postmenopausal bleeding) 07/2019   x 1   Sickle cell trait (HCC)      Family History  Problem Relation Age of Onset   Dementia Father    Colon polyps Neg Hx    Colon cancer Neg Hx    Esophageal cancer Neg Hx    Stomach cancer Neg Hx    Rectal cancer Neg Hx      Current Outpatient Medications:    Cholecalciferol (VITAMIN D3 PO), Take 1 capsule by mouth daily at 12 noon., Disp: , Rfl:    Multiple Minerals-Vitamins (CALCIUM-MAGNESIUM-ZINC-D3) TABS, , Disp: , Rfl:    Multiple Vitamins-Minerals (MULTIVITAMIN  ADULTS 50+ PO), , Disp: , Rfl:    YUVAFEM 10 MCG TABS vaginal tablet, Place 1 tablet vaginally daily., Disp: , Rfl:    Allergies  Allergen Reactions   Ancef [Cefazolin] Nausea And Vomiting   Egg-Derived Products     Vomiting, cramps, red hives   Shrimp [Shellfish Allergy]     Difficulty breathing     Review of Systems  Constitutional: Negative.   HENT: Negative.    Eyes: Negative.   Respiratory: Negative.    Cardiovascular: Negative.   Gastrointestinal: Negative.   Musculoskeletal:        Bilateral leg heaviness and pain  Skin:        Right foot has to areas are dark.   Psychiatric/Behavioral: Negative.       Today's Vitals   01/17/23 0922  BP: 120/70  Pulse: 85  Temp: 98.7 F (37.1 C)  TempSrc: Oral  Weight: 213 lb 9.6 oz (96.9 kg)  Height: 5\' 3"  (1.6 m)  PainSc: 5   PainLoc: Leg   Body mass index is 37.84 kg/m.  Wt Readings from Last 3 Encounters:  01/17/23 213 lb 9.6 oz (96.9 kg)  02/21/22 212 lb 3.2 oz (96.3 kg)  11/09/20 222 lb (100.7 kg)  Objective:  Physical Exam Vitals reviewed.  Constitutional:      Appearance: She is well-developed.  HENT:     Head: Normocephalic and atraumatic.  Eyes:     Pupils: Pupils are equal, round, and reactive to light.  Cardiovascular:     Rate and Rhythm: Normal rate and regular rhythm.     Pulses: Normal pulses.     Heart sounds: Normal heart sounds. No murmur heard. Pulmonary:     Effort: Pulmonary effort is normal. No respiratory distress.     Breath sounds: Normal breath sounds. No wheezing.  Musculoskeletal:        General: Normal range of motion.  Skin:    General: Skin is warm and dry.     Capillary Refill: Capillary refill takes less than 2 seconds.  Neurological:     General: No focal deficit present.     Mental Status: She is alert and oriented to person, place, and time.     Cranial Nerves: No cranial nerve deficit.  Psychiatric:        Mood and Affect: Mood normal.         Assessment And  Plan:  Pain in both lower extremities -     VAS [place] LOWER EXTREMITY VENOUS (DVT); Future -     CBC with Differential/Platelet  Shortness of breath -     CBC with Differential/Platelet  Obesity (BMI 30-39.9)    No follow-ups on file.  Patient was given opportunity to ask questions. Patient verbalized understanding of the plan and was able to repeat key elements of the plan. All questions were answered to their satisfaction.    Jeanell Sparrow, FNP, have reviewed all documentation for this visit. The documentation on 01/24/23 for the exam, diagnosis, procedures, and orders are all accurate and complete.   IF YOU HAVE BEEN REFERRED TO A SPECIALIST, IT MAY TAKE 1-2 WEEKS TO SCHEDULE/PROCESS THE REFERRAL. IF YOU HAVE NOT HEARD FROM US/SPECIALIST IN TWO WEEKS, PLEASE GIVE [place] A CALL AT [phone] X [phone].

## 2023-01-17 NOTE — Patient Instructions (Addendum)
You will go to [address] [facility] for an appt for the leg doppler at 10am.  You can purchase compression socks from a running store or [employer] to wear during the day for circulation I do encourage you to stretch your hips.  I encourage you to wear tennis shoes as well for better circulation

## 2023-01-18 ENCOUNTER — Other Ambulatory Visit: Payer: Self-pay | Admitting: Nurse Practitioner

## 2023-01-18 DIAGNOSIS — I83813 Varicose veins of bilateral lower extremities with pain: Secondary | ICD-10-CM

## 2023-01-23 ENCOUNTER — Other Ambulatory Visit: Payer: Self-pay | Admitting: *Deleted

## 2023-01-23 ENCOUNTER — Ambulatory Visit (HOSPITAL_COMMUNITY)
Admission: RE | Admit: 2023-01-23 | Discharge: 2023-01-23 | Disposition: A | Discharge status: Home / Self Care | Payer: 59 | Source: Ambulatory Visit | Attending: Vascular Surgery | Admitting: Vascular Surgery

## 2023-01-23 DIAGNOSIS — M79604 Pain in right leg: Secondary | ICD-10-CM

## 2023-01-23 DIAGNOSIS — M79605 Pain in left leg: Secondary | ICD-10-CM | POA: Diagnosis not present

## 2023-01-24 DIAGNOSIS — R0602 Shortness of breath: Secondary | ICD-10-CM | POA: Insufficient documentation

## 2023-01-24 NOTE — Assessment & Plan Note (Signed)
She is encouraged to strive for BMI less than 30 to decrease cardiac risk. Advised to aim for at least 150 minutes of exercise per week. She feels she has gained a significant amount of weight since being on vacation.

## 2023-01-24 NOTE — Assessment & Plan Note (Signed)
Likely related to her walking for excessive amount of time while in [place], will check for DVTs due to the long plane ride.

## 2023-01-24 NOTE — Assessment & Plan Note (Signed)
She has been having intermittent shortness of breath, will check d dimer

## 2023-01-26 DIAGNOSIS — Z1231 Encounter for screening mammogram for malignant neoplasm of breast: Secondary | ICD-10-CM | POA: Diagnosis not present

## 2023-01-26 DIAGNOSIS — Z01411 Encounter for gynecological examination (general) (routine) with abnormal findings: Secondary | ICD-10-CM | POA: Diagnosis not present

## 2023-01-26 DIAGNOSIS — Z01419 Encounter for gynecological examination (general) (routine) without abnormal findings: Secondary | ICD-10-CM | POA: Diagnosis not present

## 2023-01-26 DIAGNOSIS — Z124 Encounter for screening for malignant neoplasm of cervix: Secondary | ICD-10-CM | POA: Diagnosis not present

## 2023-01-26 DIAGNOSIS — Z113 Encounter for screening for infections with a predominantly sexual mode of transmission: Secondary | ICD-10-CM | POA: Diagnosis not present

## 2023-01-26 LAB — HM MAMMOGRAPHY

## 2023-01-31 DIAGNOSIS — M2021 Hallux rigidus, right foot: Secondary | ICD-10-CM | POA: Diagnosis not present

## 2023-01-31 DIAGNOSIS — M7741 Metatarsalgia, right foot: Secondary | ICD-10-CM | POA: Diagnosis not present

## 2023-01-31 DIAGNOSIS — M2011 Hallux valgus (acquired), right foot: Secondary | ICD-10-CM | POA: Diagnosis not present

## 2023-01-31 DIAGNOSIS — M2041 Other hammer toe(s) (acquired), right foot: Secondary | ICD-10-CM | POA: Diagnosis not present

## 2023-02-07 DIAGNOSIS — M79662 Pain in left lower leg: Secondary | ICD-10-CM | POA: Diagnosis not present

## 2023-02-07 DIAGNOSIS — M79661 Pain in right lower leg: Secondary | ICD-10-CM | POA: Diagnosis not present

## 2023-02-07 DIAGNOSIS — I83893 Varicose veins of bilateral lower extremities with other complications: Secondary | ICD-10-CM | POA: Diagnosis not present

## 2023-02-07 DIAGNOSIS — M7989 Other specified soft tissue disorders: Secondary | ICD-10-CM | POA: Diagnosis not present

## 2023-02-07 DIAGNOSIS — I87393 Chronic venous hypertension (idiopathic) with other complications of bilateral lower extremity: Secondary | ICD-10-CM | POA: Diagnosis not present

## 2023-02-07 DIAGNOSIS — M79604 Pain in right leg: Secondary | ICD-10-CM | POA: Diagnosis not present

## 2023-02-21 DIAGNOSIS — I83892 Varicose veins of left lower extremities with other complications: Secondary | ICD-10-CM | POA: Diagnosis not present

## 2023-03-02 DIAGNOSIS — Z09 Encounter for follow-up examination after completed treatment for conditions other than malignant neoplasm: Secondary | ICD-10-CM | POA: Diagnosis not present

## 2023-03-02 DIAGNOSIS — I83893 Varicose veins of bilateral lower extremities with other complications: Secondary | ICD-10-CM | POA: Diagnosis not present

## 2023-03-05 ENCOUNTER — Encounter: Payer: 59 | Admitting: Internal Medicine

## 2023-03-14 DIAGNOSIS — I83892 Varicose veins of left lower extremities with other complications: Secondary | ICD-10-CM | POA: Diagnosis not present

## 2023-03-14 DIAGNOSIS — M7741 Metatarsalgia, right foot: Secondary | ICD-10-CM | POA: Diagnosis not present

## 2023-03-14 DIAGNOSIS — M2041 Other hammer toe(s) (acquired), right foot: Secondary | ICD-10-CM | POA: Diagnosis not present

## 2023-03-14 DIAGNOSIS — M2011 Hallux valgus (acquired), right foot: Secondary | ICD-10-CM | POA: Diagnosis not present

## 2023-03-14 DIAGNOSIS — M2021 Hallux rigidus, right foot: Secondary | ICD-10-CM | POA: Diagnosis not present

## 2023-03-21 DIAGNOSIS — I83891 Varicose veins of right lower extremities with other complications: Secondary | ICD-10-CM | POA: Diagnosis not present

## 2023-03-22 DIAGNOSIS — Z09 Encounter for follow-up examination after completed treatment for conditions other than malignant neoplasm: Secondary | ICD-10-CM | POA: Diagnosis not present

## 2023-03-22 DIAGNOSIS — I83891 Varicose veins of right lower extremities with other complications: Secondary | ICD-10-CM | POA: Diagnosis not present

## 2023-03-28 DIAGNOSIS — I87392 Chronic venous hypertension (idiopathic) with other complications of left lower extremity: Secondary | ICD-10-CM | POA: Diagnosis not present

## 2023-04-04 DIAGNOSIS — I87391 Chronic venous hypertension (idiopathic) with other complications of right lower extremity: Secondary | ICD-10-CM | POA: Diagnosis not present

## 2023-04-18 DIAGNOSIS — I87391 Chronic venous hypertension (idiopathic) with other complications of right lower extremity: Secondary | ICD-10-CM | POA: Diagnosis not present

## 2023-05-14 DIAGNOSIS — M7741 Metatarsalgia, right foot: Secondary | ICD-10-CM | POA: Diagnosis not present

## 2023-05-14 DIAGNOSIS — M2011 Hallux valgus (acquired), right foot: Secondary | ICD-10-CM | POA: Diagnosis not present

## 2023-05-14 DIAGNOSIS — M2041 Other hammer toe(s) (acquired), right foot: Secondary | ICD-10-CM | POA: Diagnosis not present

## 2023-05-14 DIAGNOSIS — M2021 Hallux rigidus, right foot: Secondary | ICD-10-CM | POA: Diagnosis not present

## 2023-05-22 DIAGNOSIS — M2041 Other hammer toe(s) (acquired), right foot: Secondary | ICD-10-CM | POA: Diagnosis not present

## 2023-05-22 DIAGNOSIS — G8918 Other acute postprocedural pain: Secondary | ICD-10-CM | POA: Diagnosis not present

## 2023-05-22 DIAGNOSIS — M7741 Metatarsalgia, right foot: Secondary | ICD-10-CM | POA: Diagnosis not present

## 2023-05-22 DIAGNOSIS — M2021 Hallux rigidus, right foot: Secondary | ICD-10-CM | POA: Diagnosis not present

## 2023-05-22 HISTORY — PX: ARTHRODESIS: SHX136

## 2023-05-24 ENCOUNTER — Encounter: Payer: 59 | Admitting: Family Medicine

## 2023-07-11 DIAGNOSIS — M2041 Other hammer toe(s) (acquired), right foot: Secondary | ICD-10-CM | POA: Diagnosis not present

## 2023-07-11 DIAGNOSIS — M2021 Hallux rigidus, right foot: Secondary | ICD-10-CM | POA: Diagnosis not present

## 2023-07-26 ENCOUNTER — Encounter: Payer: 59 | Admitting: Internal Medicine

## 2023-08-13 DIAGNOSIS — M7741 Metatarsalgia, right foot: Secondary | ICD-10-CM | POA: Diagnosis not present

## 2023-08-13 DIAGNOSIS — Z4789 Encounter for other orthopedic aftercare: Secondary | ICD-10-CM | POA: Diagnosis not present

## 2023-08-13 DIAGNOSIS — M2041 Other hammer toe(s) (acquired), right foot: Secondary | ICD-10-CM | POA: Diagnosis not present

## 2023-08-13 DIAGNOSIS — M2021 Hallux rigidus, right foot: Secondary | ICD-10-CM | POA: Diagnosis not present

## 2023-09-27 ENCOUNTER — Ambulatory Visit (INDEPENDENT_AMBULATORY_CARE_PROVIDER_SITE_OTHER): Payer: Self-pay | Admitting: Internal Medicine

## 2023-09-27 VITALS — BP 120/80 | HR 87 | Temp 98.1°F | Ht 63.0 in | Wt 231.4 lb

## 2023-09-27 DIAGNOSIS — M8580 Other specified disorders of bone density and structure, unspecified site: Secondary | ICD-10-CM | POA: Diagnosis not present

## 2023-09-27 DIAGNOSIS — E7989 Other specified disorders of purine and pyrimidine metabolism: Secondary | ICD-10-CM | POA: Diagnosis not present

## 2023-09-27 DIAGNOSIS — Z0000 Encounter for general adult medical examination without abnormal findings: Secondary | ICD-10-CM | POA: Insufficient documentation

## 2023-09-27 DIAGNOSIS — E559 Vitamin D deficiency, unspecified: Secondary | ICD-10-CM

## 2023-09-27 DIAGNOSIS — Z6841 Body Mass Index (BMI) 40.0 and over, adult: Secondary | ICD-10-CM | POA: Diagnosis not present

## 2023-09-27 DIAGNOSIS — J302 Other seasonal allergic rhinitis: Secondary | ICD-10-CM

## 2023-09-27 DIAGNOSIS — E7800 Pure hypercholesterolemia, unspecified: Secondary | ICD-10-CM | POA: Insufficient documentation

## 2023-09-27 DIAGNOSIS — E66813 Obesity, class 3: Secondary | ICD-10-CM | POA: Diagnosis not present

## 2023-09-27 NOTE — Progress Notes (Signed)
 I,Victoria T Basil Lim, CMA,acting as a Neurosurgeon for Smiley Dung, MD.,have documented all relevant documentation on the behalf of Smiley Dung, MD,as directed by  Smiley Dung, MD while in the presence of Smiley Dung, MD.  Subjective:    Patient ID: Kimberly Berry , female    DOB: 10-11-1965 , 58 y.o.   MRN: [id number]  Chief Complaint  Patient presents with   Annual Exam    She presents today for annual exam. She has no specific questions or concerns.  GYN: Dr Harless Lien.     HPI Discussed the use of AI scribe software for clinical note transcription with the patient, who gave verbal consent to proceed.  History of Present Illness Kimberly Berry is a 59 year old female who presents for an annual physical exam.  She underwent foot surgery last year, which involved the placement of rods, screws, and plates, leading to a period of immobility and subsequent weight gain. She has since joined [employer] and participates in water exercises three times a week. She was cleared to drive three weeks ago.  She experienced leg pain and heaviness after extensive walking in [place] last year, which led to blisters on her right foot. A vein specialist ruled out a blood clot and performed vein ablation on both legs, significantly improving her symptoms.  She has seasonal allergies, particularly exacerbated this year, and takes Claritin as needed for relief.  Her medical history includes osteopenia and scoliosis, with rods and screws in her spine. She had a bone density test last year and is taking Uvafrem, a vaginal estrogen insert, twice weekly. She also takes calcium supplements.  She has no family history of heart disease. She stopped using hair relaxers in 2009 due to concerns about postmenopausal bleeding and fibroid tumors.  She is drinking water regularly and plans to gradually incorporate walking into her exercise routine to aid in weight loss and bone health.    Past Medical  History:  Diagnosis Date   Allergy 1968   Eggs   Anemia yrs ago   hx IDA   Chronic back pain    Idiopathic scoliosis    PMB (postmenopausal bleeding) 07/2019   x 1   Sickle cell trait (HCC)      Family History  Problem Relation Age of Onset   Varicose Veins Mother    Dementia Father    Colon polyps Neg Hx    Colon cancer Neg Hx    Esophageal cancer Neg Hx    Stomach cancer Neg Hx    Rectal cancer Neg Hx      Current Outpatient Medications:    Cholecalciferol (VITAMIN D3 PO), Take 1 capsule by mouth daily at 12 noon., Disp: , Rfl:    Multiple Minerals-Vitamins (CALCIUM-MAGNESIUM-ZINC-D3) TABS, , Disp: , Rfl:    Multiple Vitamins-Minerals (MULTIVITAMIN ADULTS 50+ PO), , Disp: , Rfl:    YUVAFEM 10 MCG TABS vaginal tablet, Place 1 tablet vaginally daily. She uses twice weekly per GYN, Disp: , Rfl:    Allergies  Allergen Reactions   Ancef  [Cefazolin ] Nausea And Vomiting   Egg-Derived Products     Vomiting, cramps, red hives   Shrimp [Shellfish Allergy]     Difficulty breathing      The patient states she uses post menopausal status for birth control. Patient's last menstrual period was 06/05/2017.. Negative for Dysmenorrhea. Negative for: breast discharge, breast lump(s), breast pain and breast self exam. Associated symptoms include abnormal vaginal bleeding. Pertinent  negatives include abnormal bleeding (hematology), anxiety, decreased libido, depression, difficulty falling sleep, dyspareunia, history of infertility, nocturia, sexual dysfunction, sleep disturbances, urinary incontinence, urinary urgency, vaginal discharge and vaginal itching. Diet regular.The patient states her exercise level is  moderate.  . The patient's tobacco use is:  Social History   Tobacco Use  Smoking Status Never  Smokeless Tobacco Never  . She has been exposed to passive smoke. The patient's alcohol use is:  Social History   Substance and Sexual Activity  Alcohol Use Never   Review of  Systems  Constitutional: Negative.   HENT: Negative.    Eyes: Negative.   Respiratory: Negative.    Cardiovascular: Negative.   Gastrointestinal: Negative.   Endocrine: Negative.   Genitourinary: Negative.   Musculoskeletal: Negative.   Skin: Negative.   Allergic/Immunologic: Negative.   Neurological: Negative.   Hematological: Negative.   Psychiatric/Behavioral: Negative.       Today's Vitals   09/27/23 1137  BP: 120/80  Pulse: 87  Temp: 98.1 F (36.7 C)  SpO2: 98%  Weight: 231 lb 6.4 oz (105 kg)  Height: 5\' 3"  (1.6 m)   Body mass index is 40.99 kg/m.  Wt Readings from Last 3 Encounters:  09/27/23 231 lb 6.4 oz (105 kg)  01/17/23 213 lb 9.6 oz (96.9 kg)  02/21/22 212 lb 3.2 oz (96.3 kg)    The 10-year ASCVD risk score (Arnett DK, et al., 2019) is: 3.2%   Values used to calculate the score:     Age: 81 years     Sex: Female     Is Non-Hispanic African American: Yes     Diabetic: No     Tobacco smoker: No     Systolic Blood Pressure: 120 mmHg     Is BP treated: No     HDL Cholesterol: 59 mg/dL     Total Cholesterol: 191 mg/dL   Objective:  Physical Exam Vitals and nursing note reviewed.  Constitutional:      Appearance: Normal appearance. She is obese.  HENT:     Head: Normocephalic and atraumatic.     Right Ear: Tympanic membrane, ear canal and external ear normal.     Left Ear: Tympanic membrane, ear canal and external ear normal.  Eyes:     Extraocular Movements: Extraocular movements intact.     Conjunctiva/sclera: Conjunctivae normal.     Pupils: Pupils are equal, round, and reactive to light.  Cardiovascular:     Rate and Rhythm: Normal rate and regular rhythm.     Pulses: Normal pulses.     Heart sounds: Normal heart sounds.  Pulmonary:     Effort: Pulmonary effort is normal.     Breath sounds: Normal breath sounds.  Chest:  Breasts:    Tanner Score is 5.     Right: Normal.     Left: Normal.  Abdominal:     General: Abdomen is flat. Bowel  sounds are normal.     Palpations: Abdomen is soft.  Genitourinary:    Comments: deferred Musculoskeletal:        General: Normal range of motion.     Cervical back: Normal range of motion and neck supple.  Skin:    General: Skin is warm and dry.  Neurological:     General: No focal deficit present.     Mental Status: She is alert and oriented to person, place, and time.  Psychiatric:        Mood and Affect: Mood normal.  Behavior: Behavior normal.         Assessment And Plan:     Encounter for general adult medical examination w/o abnormal findings Assessment & Plan: A full exam was performed.  Importance of monthly self breast exams was discussed with the patient.  She is advised to get 30-45 minutes of regular exercise, no less than four to five days per week. Both weight-bearing and aerobic exercises are recommended.  She is advised to follow a healthy diet with at least six fruits/veggies per day, decrease intake of red meat and other saturated fats and to increase fish intake to twice weekly.  Meats/fish should not be fried -- baked, boiled or broiled is preferable. It is also important to cut back on your sugar intake.  Be sure to read labels - try to avoid anything with added sugar, high fructose corn syrup or other sweeteners.  If you must use a sweetener, you can try stevia or monkfruit.  It is also important to avoid artificially sweetened foods/beverages and diet drinks. Lastly, wear SPF 50 sunscreen on exposed skin and when in direct sunlight for an extended period of time.  Be sure to avoid fast food restaurants and aim for at least 60 ounces of water daily.      Orders: -     CBC -     CMP14+EGFR -     Lipid panel -     Hemoglobin A1c -     TSH  Pure hypercholesterolemia Assessment & Plan: Chronic, I will check labs as below. WE discussed use of cardiac calcium scoring, she is aware of the $99 fee. She also agrees to Lp(a) testing. Encouraged to follow heart  healthy lifestyle.   Orders: -     Lipid panel -     CT CARDIAC SCORING (SELF PAY ONLY); Future -     Lipoprotein A (LPA)  Seasonal allergies Assessment & Plan: Increased symptoms this year. Using Claritin as needed. - Advise continued use of Claritin as needed through mid-June.   Osteopenia, unspecified location Assessment & Plan: Diagnosed with osteopenia. Discussed weight-bearing exercises, calcium supplementation, and early bone density testing. - Encourage walking 30 minutes twice a week. - Request bone density test results from [facility] - DEXA due October 2025, will send order to [facility]   Vitamin D  deficiency disease Assessment & Plan: I WILL CHECK A VIT D LEVEL AND SUPPLEMENT AS NEEDED.  ALSO ENCOURAGED TO SPEND 15 MINUTES IN THE SUN DAILY.   Orders: -     VITAMIN D  25 Hydroxy (Vit-D Deficiency, Fractures)  Class 3 severe obesity due to excess calories with serious comorbidity and body mass index (BMI) of 40.0 to 44.9 in adult [facility]) Assessment & Plan: She is aware of 18lb weight gain since August 2024.  She is encouraged to gradually start a walking program, aiming for at least 30 minute 3 days per week. She is currently participating in water aerobics three days per week.       Return for 1 YEAR HM . Patient was given opportunity to ask questions. Patient verbalized understanding of the plan and was able to repeat key elements of the plan. All questions were answered to their satisfaction.   I, Smiley Dung, MD, have reviewed all documentation for this visit. The documentation on 09/27/23 for the exam, diagnosis, procedures, and orders are all accurate and complete.

## 2023-09-27 NOTE — Patient Instructions (Signed)

## 2023-09-27 NOTE — Assessment & Plan Note (Signed)
 She is aware of 18lb weight gain since August 2024.  She is encouraged to gradually start a walking program, aiming for at least 30 minute 3 days per week. She is currently participating in water aerobics three days per week.

## 2023-09-27 NOTE — Assessment & Plan Note (Signed)
 I WILL CHECK A VIT D LEVEL AND SUPPLEMENT AS NEEDED.  ALSO ENCOURAGED TO SPEND 15 MINUTES IN THE SUN DAILY.

## 2023-09-27 NOTE — Assessment & Plan Note (Signed)

## 2023-09-28 ENCOUNTER — Encounter: Payer: Self-pay | Admitting: Internal Medicine

## 2023-09-28 LAB — HEMOGLOBIN A1C
Est. average glucose Bld gHb Est-mCnc: 97 mg/dL
Hgb A1c MFr Bld: 5 % (ref 4.8–5.6)

## 2023-09-28 LAB — CBC
Hematocrit: 38.6 % (ref 34.0–46.6)
Hemoglobin: 12.8 g/dL (ref 11.1–15.9)
MCH: 28.6 pg (ref 26.6–33.0)
MCHC: 33.2 g/dL (ref 31.5–35.7)
MCV: 86 fL (ref 79–97)
Platelets: 281 10*3/uL (ref 150–450)
RBC: 4.47 x10E6/uL (ref 3.77–5.28)
RDW: 12.9 % (ref 11.7–15.4)
WBC: 6.8 10*3/uL (ref 3.4–10.8)

## 2023-09-28 LAB — CMP14+EGFR
ALT: 16 IU/L (ref 0–32)
AST: 20 IU/L (ref 0–40)
Albumin: 4.7 g/dL (ref 3.8–4.9)
Alkaline Phosphatase: 106 IU/L (ref 44–121)
BUN/Creatinine Ratio: 20 (ref 9–23)
BUN: 13 mg/dL (ref 6–24)
Bilirubin Total: 0.3 mg/dL (ref 0.0–1.2)
CO2: 23 mmol/L (ref 20–29)
Calcium: 9.7 mg/dL (ref 8.7–10.2)
Chloride: 105 mmol/L (ref 96–106)
Creatinine, Ser: 0.64 mg/dL (ref 0.57–1.00)
Globulin, Total: 2.9 g/dL (ref 1.5–4.5)
Glucose: 77 mg/dL (ref 70–99)
Potassium: 4.2 mmol/L (ref 3.5–5.2)
Sodium: 142 mmol/L (ref 134–144)
Total Protein: 7.6 g/dL (ref 6.0–8.5)
eGFR: 102 mL/min/{1.73_m2} (ref >59)

## 2023-09-28 LAB — LIPID PANEL
Chol/HDL Ratio: 3.2 ratio (ref 0.0–4.4)
Cholesterol, Total: 191 mg/dL (ref 100–199)
HDL: 59 mg/dL (ref >39)
LDL Chol Calc (NIH): 115 mg/dL — ABNORMAL HIGH (ref 0–99)
Triglycerides: 93 mg/dL (ref 0–149)
VLDL Cholesterol Cal: 17 mg/dL (ref 5–40)

## 2023-09-28 LAB — LIPOPROTEIN A (LPA): Lipoprotein (a): 35.3 nmol/L (ref <75.0)

## 2023-09-28 LAB — VITAMIN D 25 HYDROXY (VIT D DEFICIENCY, FRACTURES): Vit D, 25-Hydroxy: 40.5 ng/mL (ref 30.0–100.0)

## 2023-09-28 LAB — TSH: TSH: 0.231 u[IU]/mL — ABNORMAL LOW (ref 0.450–4.500)

## 2023-10-01 ENCOUNTER — Encounter: Payer: Self-pay | Admitting: Internal Medicine

## 2023-10-01 DIAGNOSIS — J302 Other seasonal allergic rhinitis: Secondary | ICD-10-CM | POA: Insufficient documentation

## 2023-10-01 DIAGNOSIS — M8580 Other specified disorders of bone density and structure, unspecified site: Secondary | ICD-10-CM | POA: Insufficient documentation

## 2023-10-01 NOTE — Assessment & Plan Note (Signed)
 Chronic, I will check labs as below. WE discussed use of cardiac calcium scoring, she is aware of the $99 fee. She also agrees to Lp(a) testing. Encouraged to follow heart healthy lifestyle.

## 2023-10-01 NOTE — Assessment & Plan Note (Signed)
 Diagnosed with osteopenia. Discussed weight-bearing exercises, calcium supplementation, and early bone density testing. - Encourage walking 30 minutes twice a week. - Request bone density test results from [facility] - DEXA due October 2025, will send order to [facility]

## 2023-10-01 NOTE — Assessment & Plan Note (Signed)
 Increased symptoms this year. Using Claritin as needed. - Advise continued use of Claritin as needed through mid-June.

## 2023-10-03 LAB — SPECIMEN STATUS REPORT

## 2023-10-03 LAB — T4, FREE: Free T4: 1.37 ng/dL (ref 0.82–1.77)

## 2023-10-24 ENCOUNTER — Ambulatory Visit (HOSPITAL_BASED_OUTPATIENT_CLINIC_OR_DEPARTMENT_OTHER)
Admission: RE | Admit: 2023-10-24 | Discharge: 2023-10-24 | Disposition: A | Discharge status: Home / Self Care | Payer: Self-pay | Source: Ambulatory Visit | Attending: Internal Medicine | Admitting: Internal Medicine

## 2023-10-24 DIAGNOSIS — E7800 Pure hypercholesterolemia, unspecified: Secondary | ICD-10-CM | POA: Insufficient documentation

## 2023-10-24 DIAGNOSIS — Z780 Asymptomatic menopausal state: Secondary | ICD-10-CM | POA: Diagnosis not present

## 2023-10-25 ENCOUNTER — Ambulatory Visit: Payer: Self-pay | Admitting: Internal Medicine

## 2023-12-04 ENCOUNTER — Other Ambulatory Visit: Payer: Self-pay | Admitting: Internal Medicine

## 2023-12-04 DIAGNOSIS — N6312 Unspecified lump in the right breast, upper inner quadrant: Secondary | ICD-10-CM

## 2023-12-11 DIAGNOSIS — N6315 Unspecified lump in the right breast, overlapping quadrants: Secondary | ICD-10-CM | POA: Diagnosis not present

## 2023-12-11 LAB — HM MAMMOGRAPHY

## 2023-12-13 ENCOUNTER — Encounter: Payer: Self-pay | Admitting: Internal Medicine

## 2024-10-06 ENCOUNTER — Encounter: Payer: Self-pay | Admitting: Internal Medicine
